# Patient Record
Sex: Male | Born: 1935 | Race: White | Hispanic: No | Marital: Married
Health system: Southern US, Community
[De-identification: ages and names within clinical notes are randomized; demographics above are authoritative.]

---

## 2004-08-27 ENCOUNTER — Ambulatory Visit: Payer: Self-pay

## 2005-06-07 ENCOUNTER — Other Ambulatory Visit: Payer: Self-pay

## 2005-06-07 ENCOUNTER — Emergency Department: Payer: Self-pay | Admitting: Unknown Physician Specialty

## 2005-06-08 ENCOUNTER — Ambulatory Visit: Payer: Self-pay | Admitting: Urology

## 2005-07-03 ENCOUNTER — Other Ambulatory Visit: Payer: Self-pay

## 2005-07-03 ENCOUNTER — Ambulatory Visit: Payer: Self-pay | Admitting: Urology

## 2005-07-30 ENCOUNTER — Ambulatory Visit: Payer: Self-pay | Admitting: Urology

## 2005-08-06 ENCOUNTER — Ambulatory Visit: Payer: Self-pay | Admitting: Urology

## 2005-08-21 ENCOUNTER — Ambulatory Visit: Payer: Self-pay | Admitting: Urology

## 2005-12-15 ENCOUNTER — Inpatient Hospital Stay: Payer: Self-pay | Admitting: General Practice

## 2005-12-18 ENCOUNTER — Encounter: Payer: Self-pay | Admitting: Internal Medicine

## 2005-12-22 ENCOUNTER — Encounter: Payer: Self-pay | Admitting: Internal Medicine

## 2005-12-24 ENCOUNTER — Ambulatory Visit: Payer: Self-pay | Admitting: Internal Medicine

## 2006-01-12 ENCOUNTER — Encounter: Payer: Self-pay | Admitting: General Practice

## 2006-01-18 ENCOUNTER — Encounter: Payer: Self-pay | Admitting: Internal Medicine

## 2006-01-18 ENCOUNTER — Encounter: Payer: Self-pay | Admitting: General Practice

## 2006-01-18 ENCOUNTER — Ambulatory Visit: Payer: Self-pay | Admitting: Urology

## 2006-02-18 ENCOUNTER — Encounter: Payer: Self-pay | Admitting: General Practice

## 2006-07-28 ENCOUNTER — Ambulatory Visit: Payer: Self-pay | Admitting: Urology

## 2006-08-18 ENCOUNTER — Ambulatory Visit: Payer: Self-pay | Admitting: General Practice

## 2006-08-18 ENCOUNTER — Other Ambulatory Visit: Payer: Self-pay

## 2006-09-01 ENCOUNTER — Inpatient Hospital Stay: Payer: Self-pay | Admitting: General Practice

## 2007-08-04 ENCOUNTER — Ambulatory Visit: Payer: Self-pay | Admitting: Urology

## 2007-09-02 IMAGING — CR DG CHEST 2V
1 series · 3 of 3 positions shown · non-contrast
Comparison: none

REASON FOR EXAM: Arm pain/dizziness
COMMENTS:

[Series 1: view not recorded · 0.17mm/px · 3 of 3 slices shown]
[im 1/3]
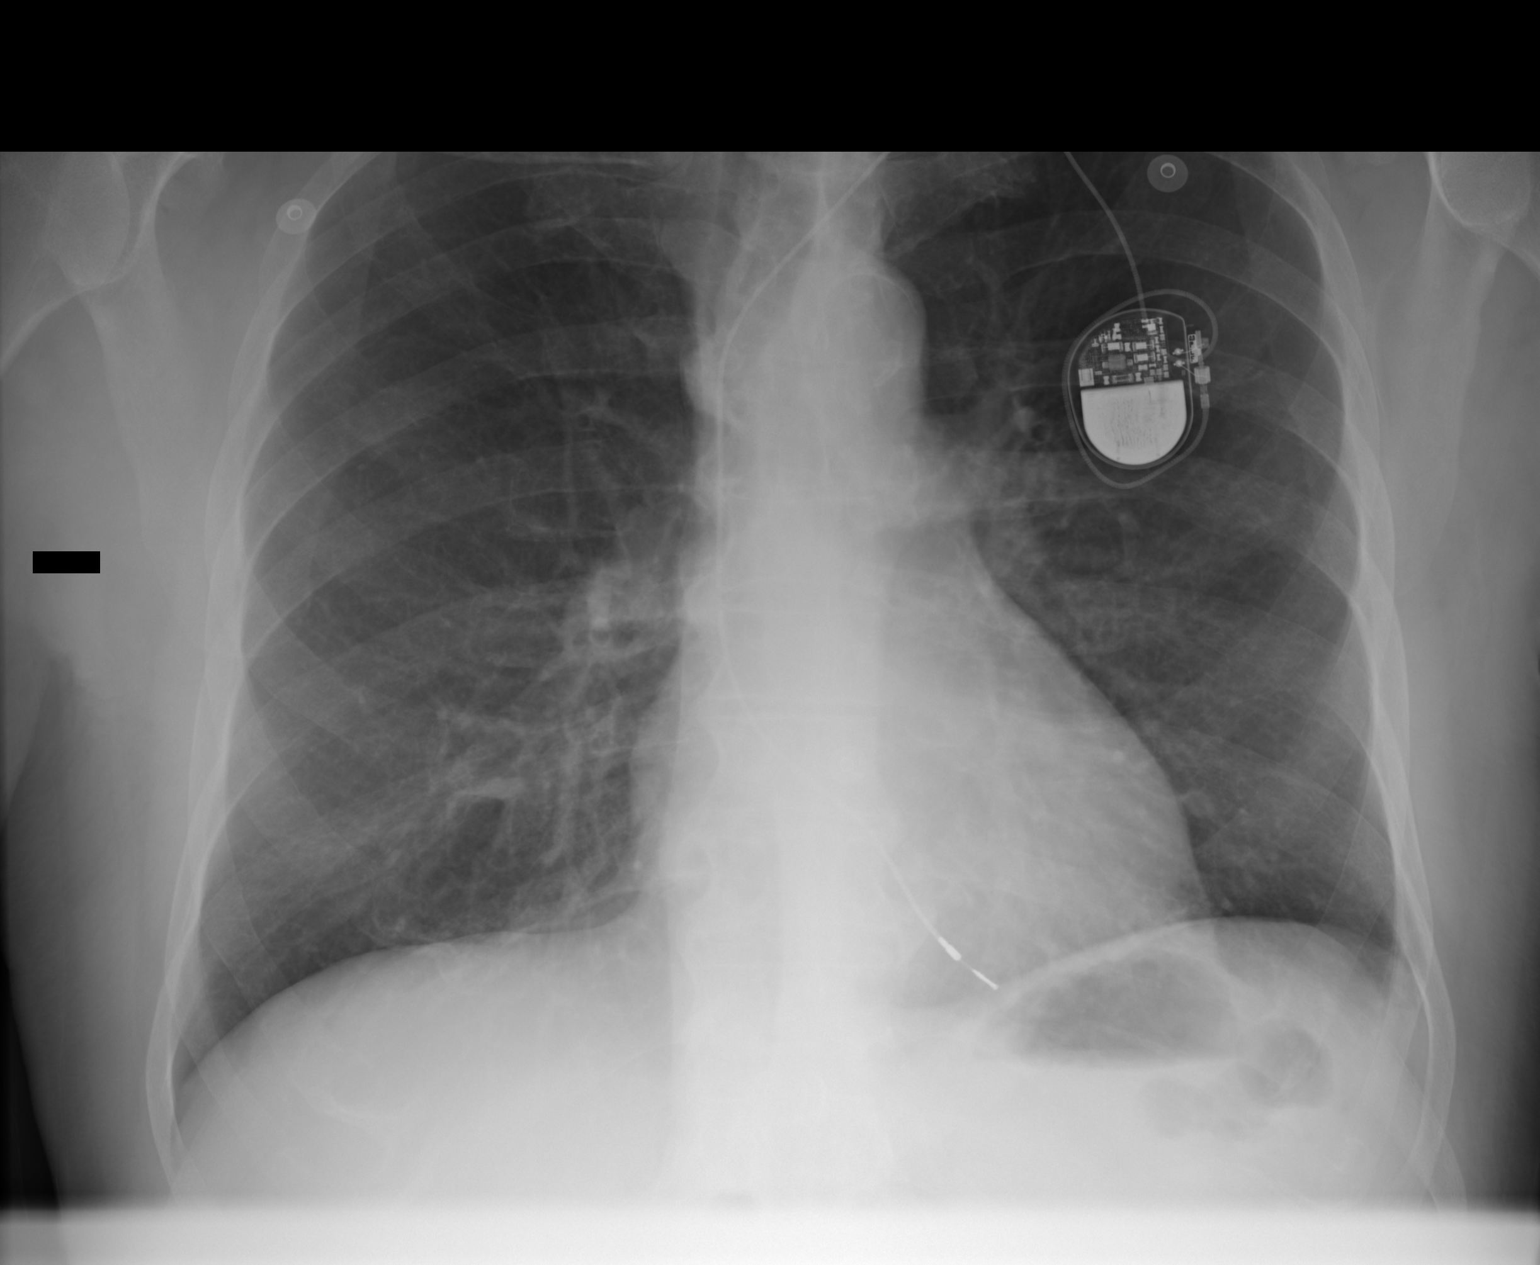
[im 2/3]
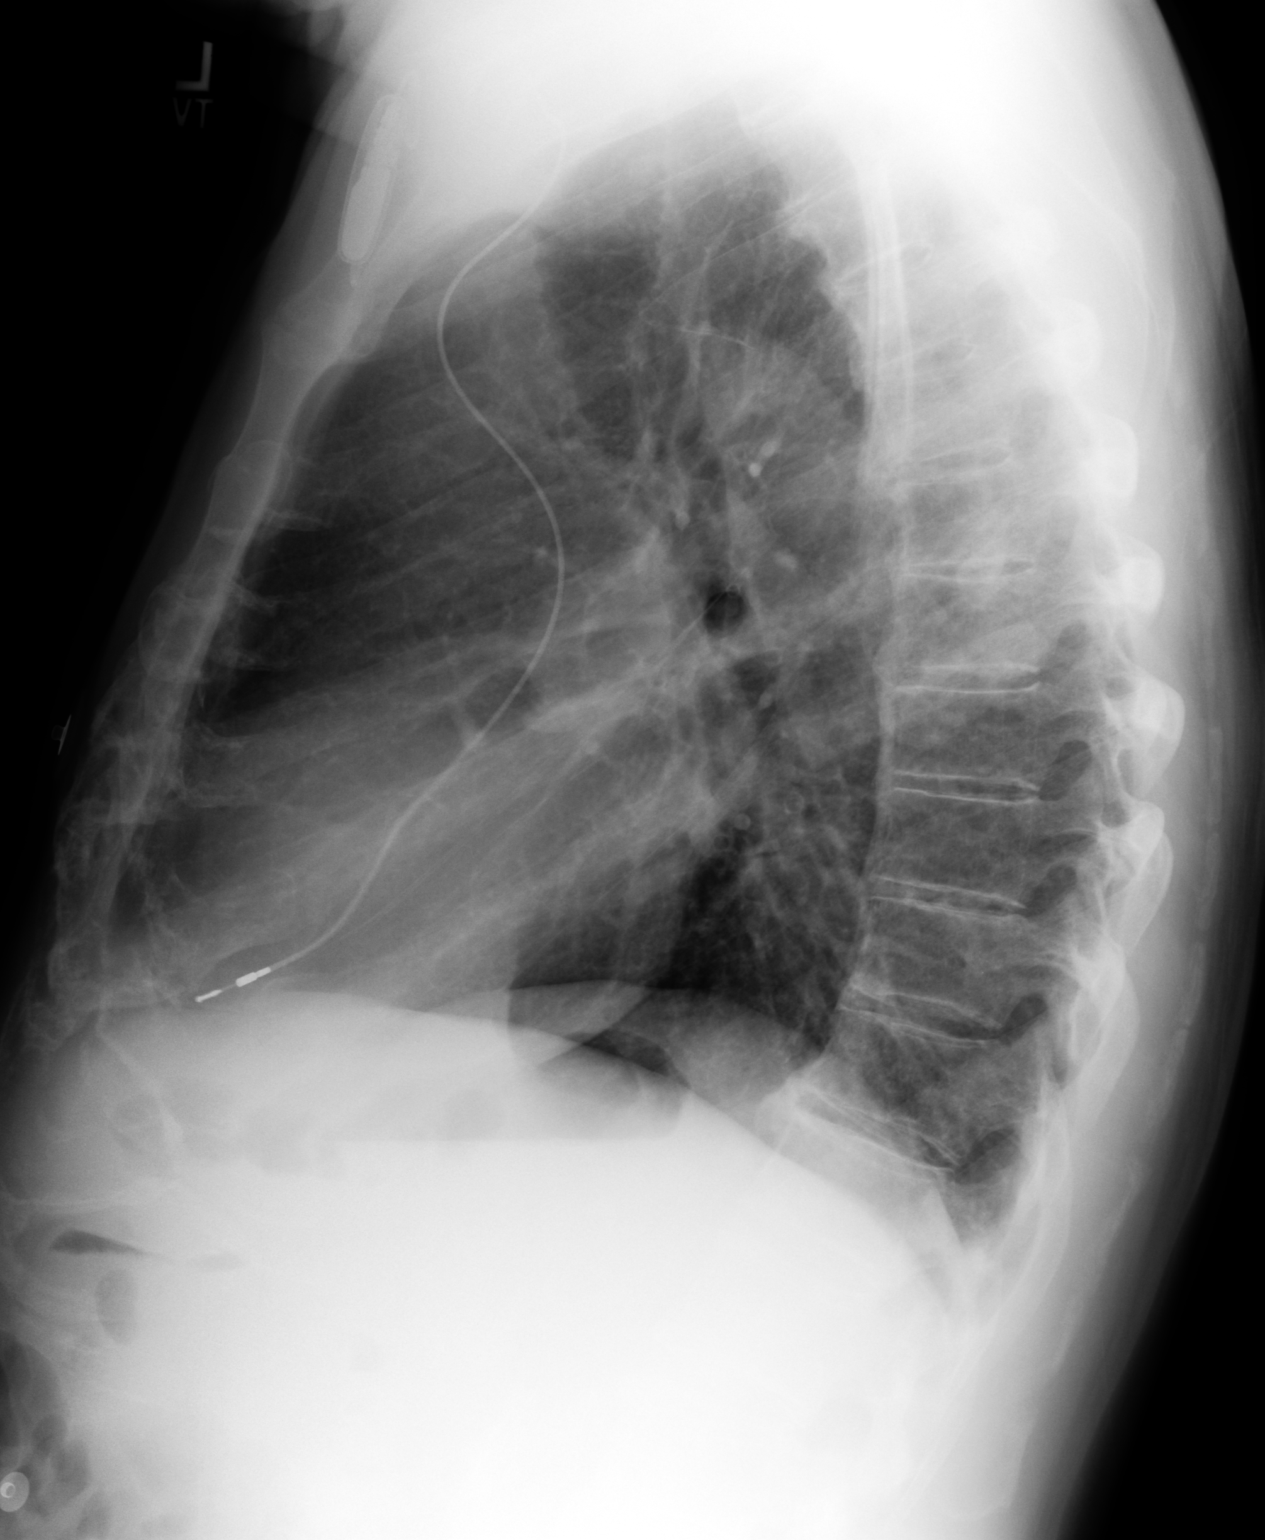
[im 3/3]
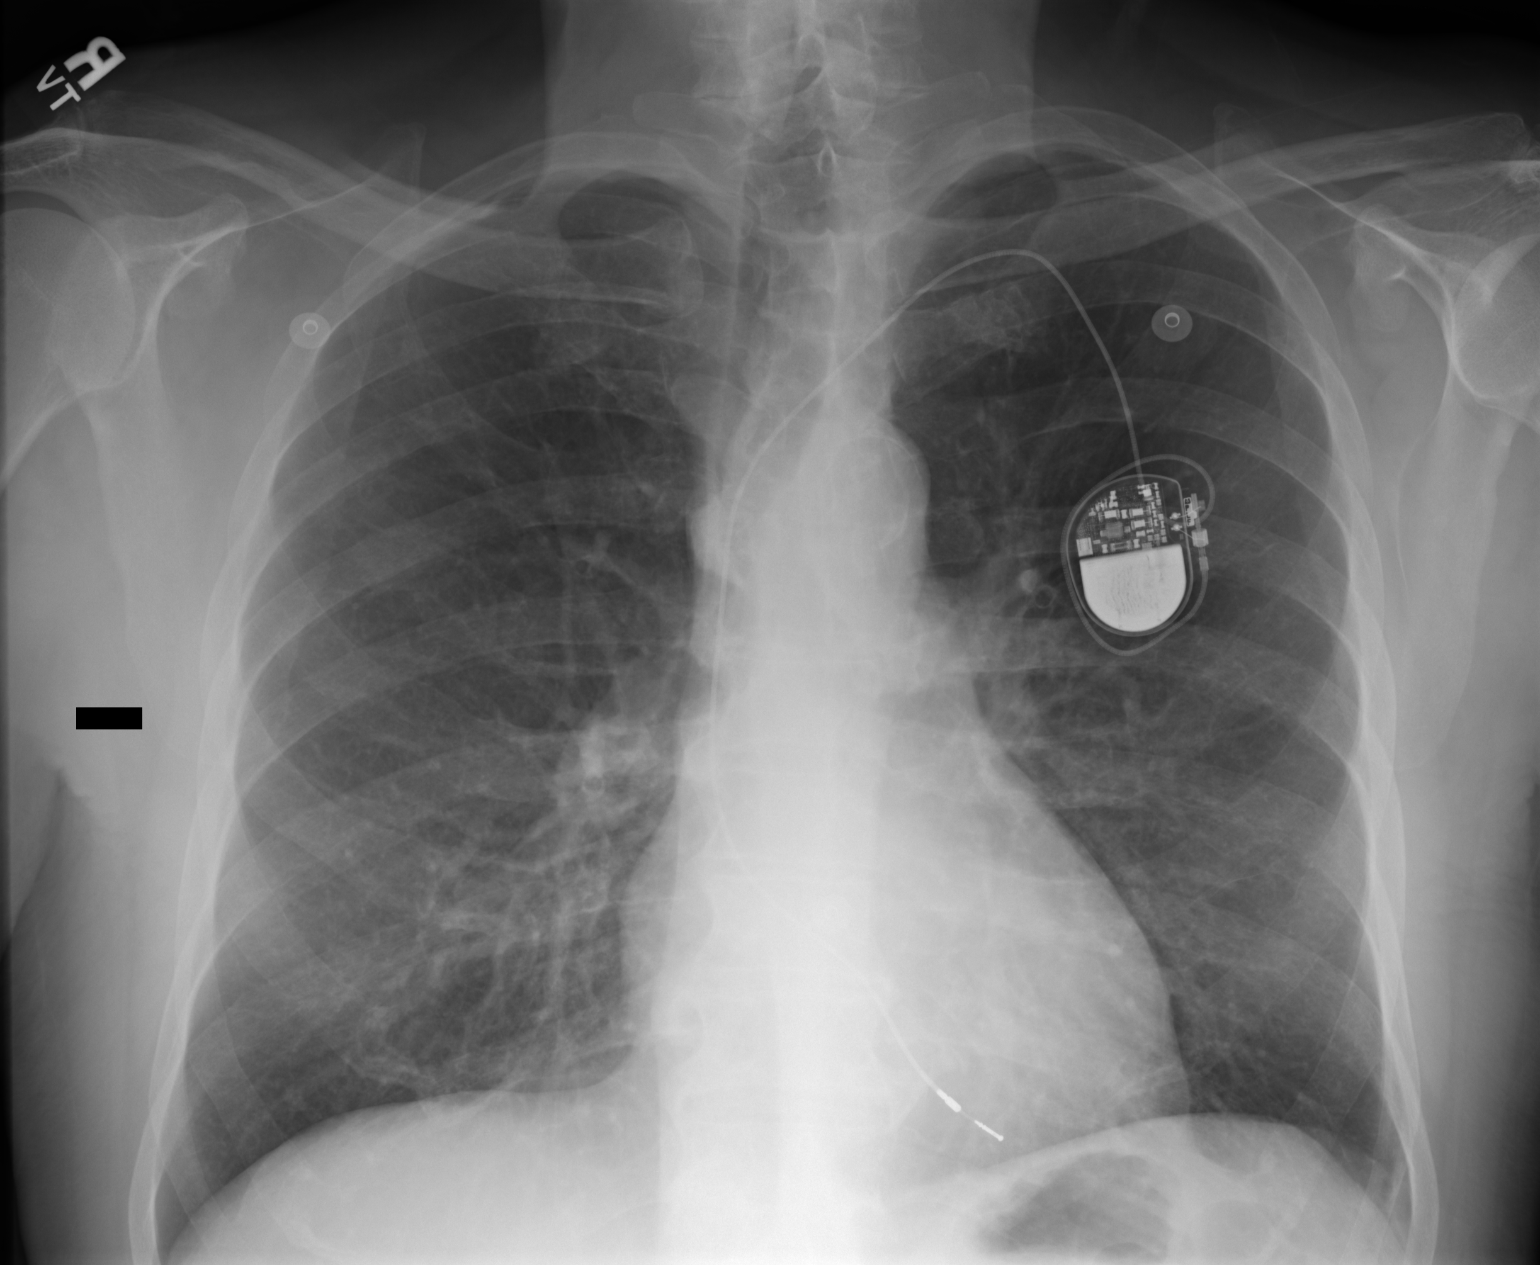

[3 of 3 positions shown; findings below may reference images not displayed]

PROCEDURE:     DXR - DXR CHEST PA (OR AP) AND LATERAL  - June 07, 2005 [DATE]

RESULT:          A LEFT-sided pacemaker device is present.  The heart is not
enlarged.  Some degenerative spurring is seen in the thoracic spine.  There
is no effusion.  There is no pneumothorax.  There is no evidence of failure.
IMPRESSION: 1.     No acute cardiopulmonary disease.  COPD change is present.
2.     Pacemaker device present.
3.     The prominence questioned in the RIGHT hilum appears to be secondary
to pulmonary vasculature.

## 2007-11-25 ENCOUNTER — Ambulatory Visit: Payer: Self-pay | Admitting: Urology

## 2008-06-08 ENCOUNTER — Emergency Department: Payer: Self-pay | Admitting: Emergency Medicine

## 2008-07-04 ENCOUNTER — Ambulatory Visit: Payer: Self-pay | Admitting: Urology

## 2008-10-11 ENCOUNTER — Ambulatory Visit: Payer: Self-pay | Admitting: Urology

## 2008-10-22 IMAGING — CR DG ABDOMEN 1V
1 series · 2 of 2 positions shown · non-contrast
Comparison: none

REASON FOR EXAM: xray kub nephrolithiasis need films
COMMENTS:

[Series 1: view not recorded · 0.17mm/px · 2 of 2 slices shown]
[im 1/2]
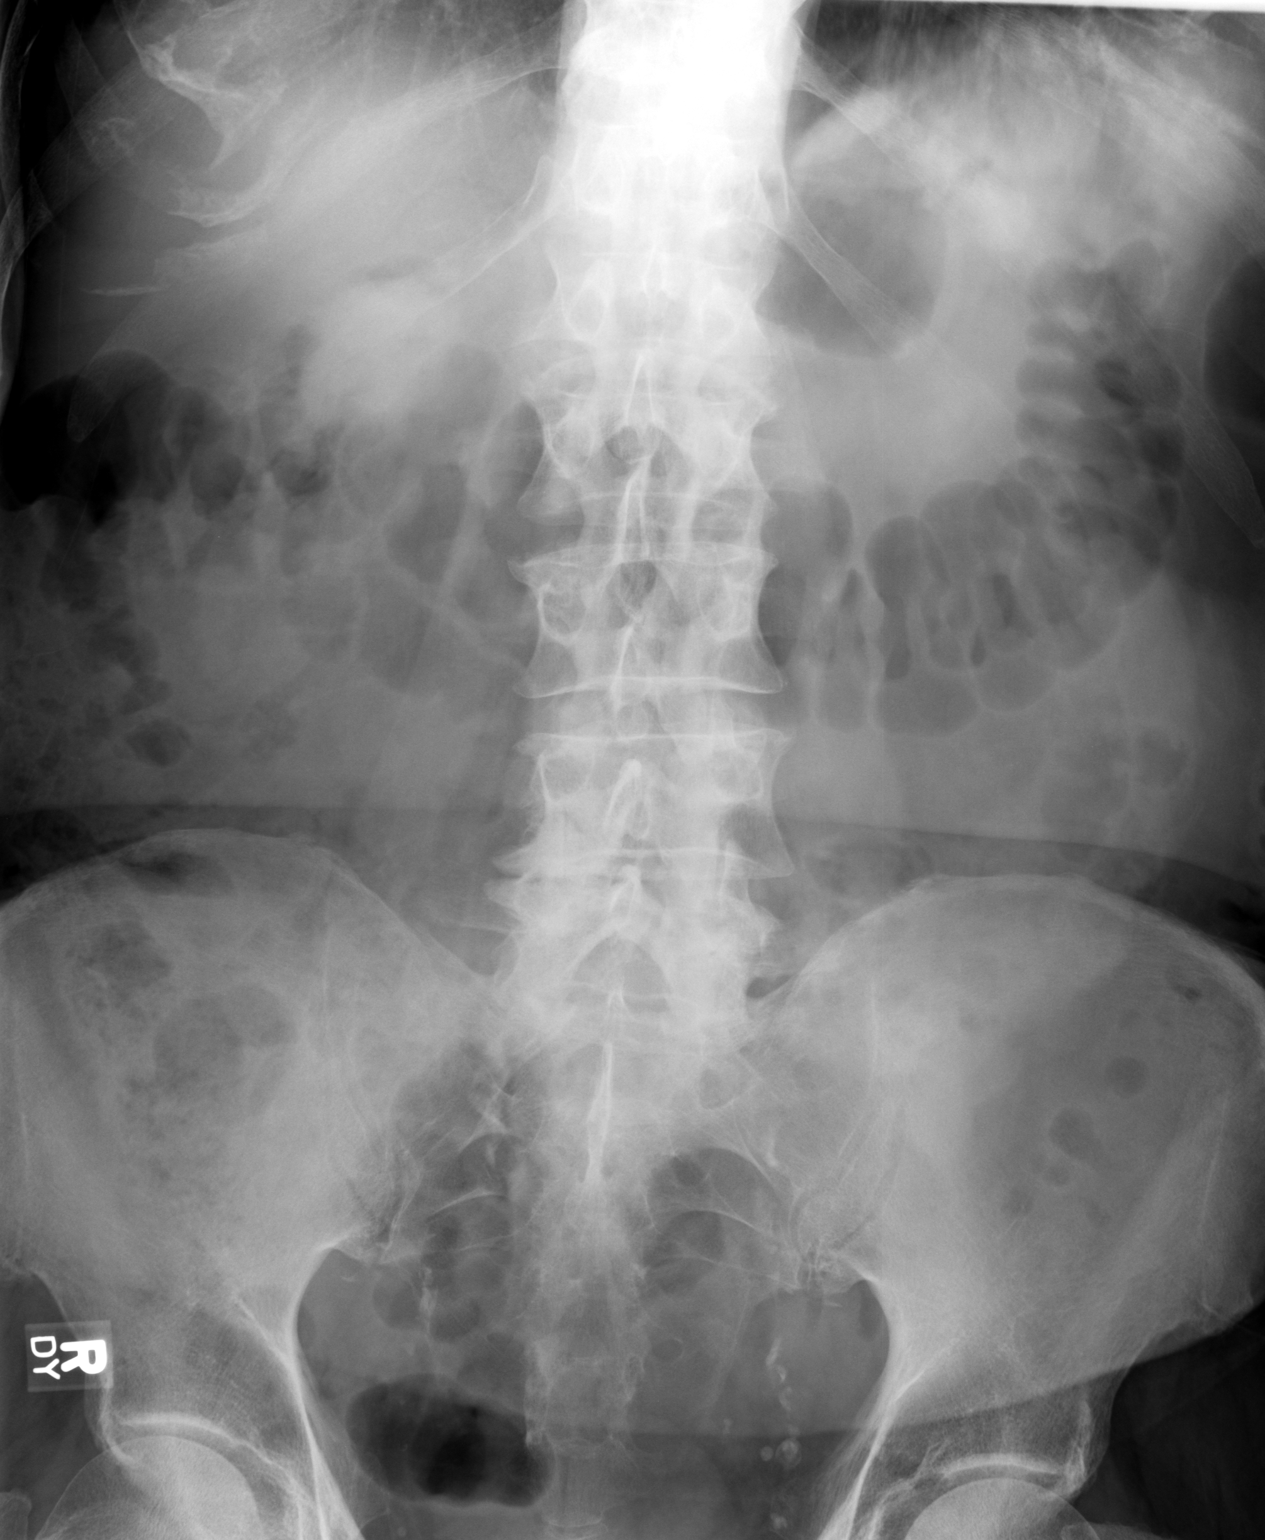
[im 2/2]
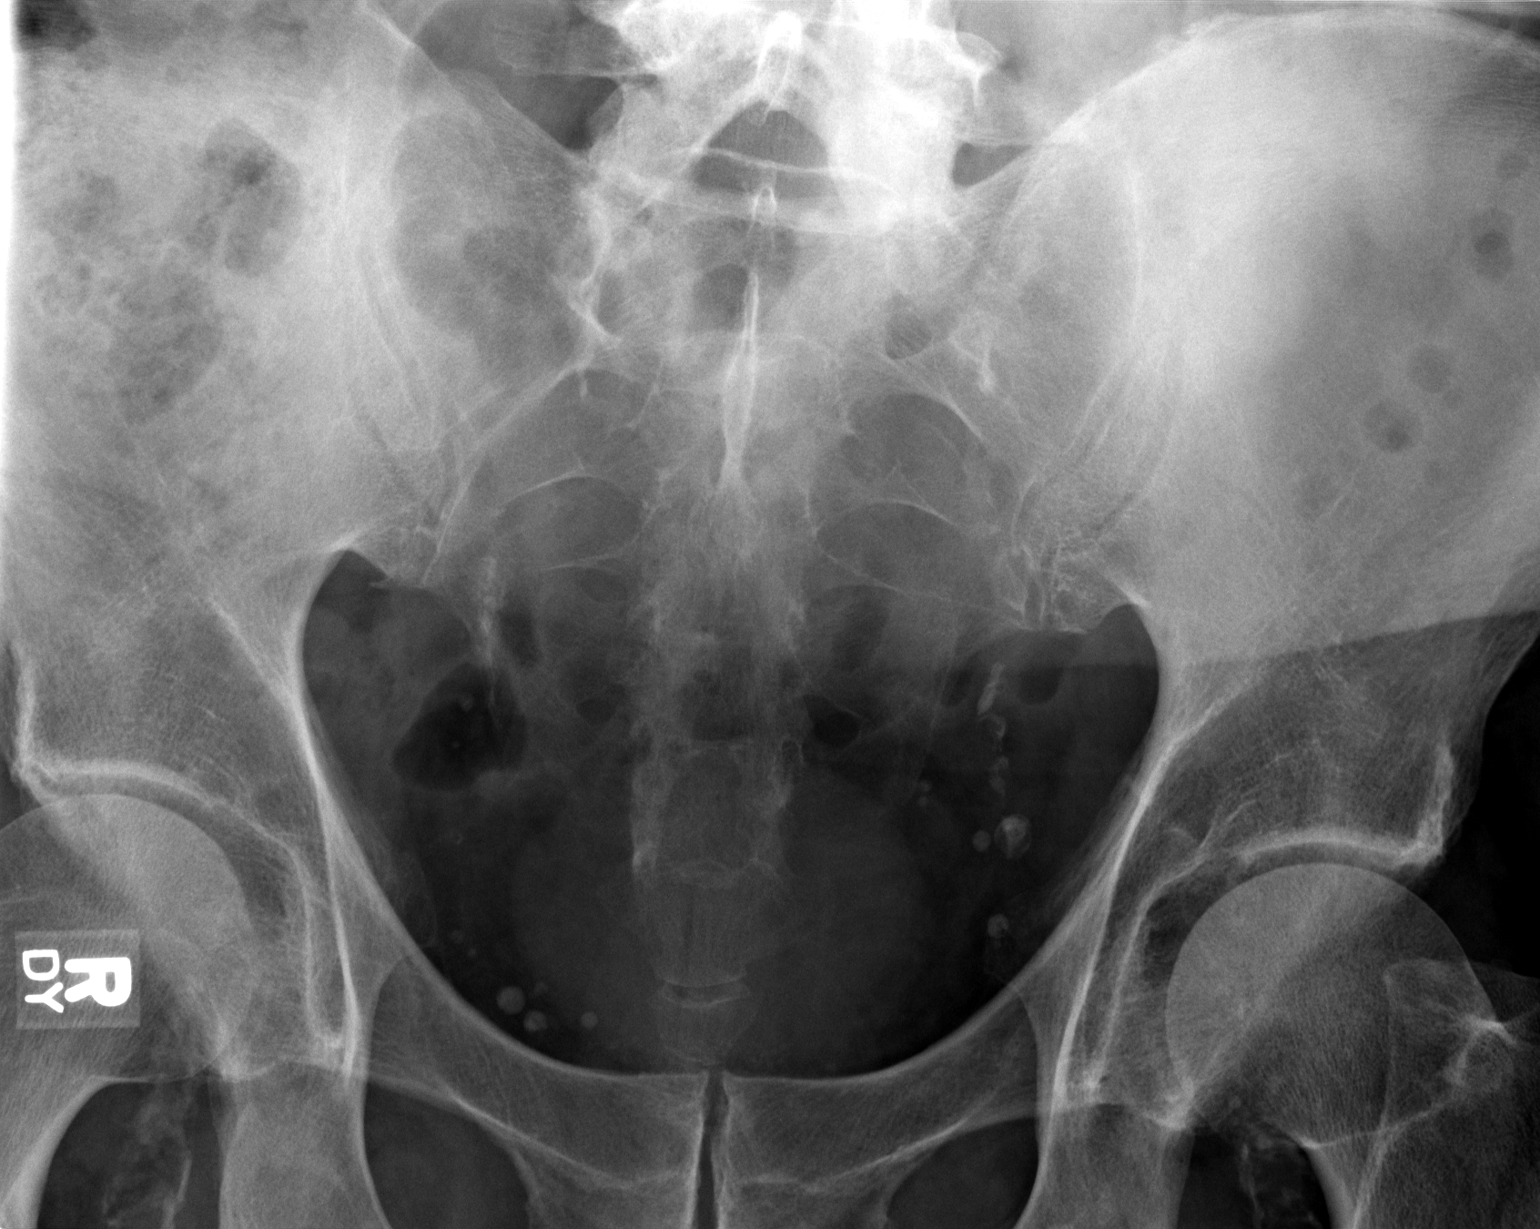

[2 of 2 positions shown; findings below may reference images not displayed]

PROCEDURE:     DXR - DXR KIDNEY URETER BLADDER  - July 28, 2006  [DATE]

RESULT:     Comparison is made to the prior exam of 01/18/2006. The patient
had a 5 mm RIGHT renal stone observed on prior CT of 06/07/2005. On the
current plain film, no definite renal or ureteral calcifications are
identified. Poorly calcified stones could still be present and not
identified.
IMPRESSION: 1.     No definite renal or ureteral stones are identified on plain film
examination.

## 2008-12-03 ENCOUNTER — Ambulatory Visit: Payer: Self-pay | Admitting: Family Medicine

## 2009-04-26 ENCOUNTER — Ambulatory Visit: Payer: Self-pay | Admitting: Cardiology

## 2009-04-30 ENCOUNTER — Ambulatory Visit: Payer: Self-pay | Admitting: Cardiology

## 2009-10-29 ENCOUNTER — Ambulatory Visit: Payer: Self-pay | Admitting: Urology

## 2010-02-19 IMAGING — CR DG ABDOMEN 1V
1 series · 2 of 2 positions shown · non-contrast
Comparison: none

REASON FOR EXAM: Nephrolithiasis
COMMENTS:

[Series 1: view not recorded · 0.17mm/px · 2 of 2 slices shown]
[im 1/2]
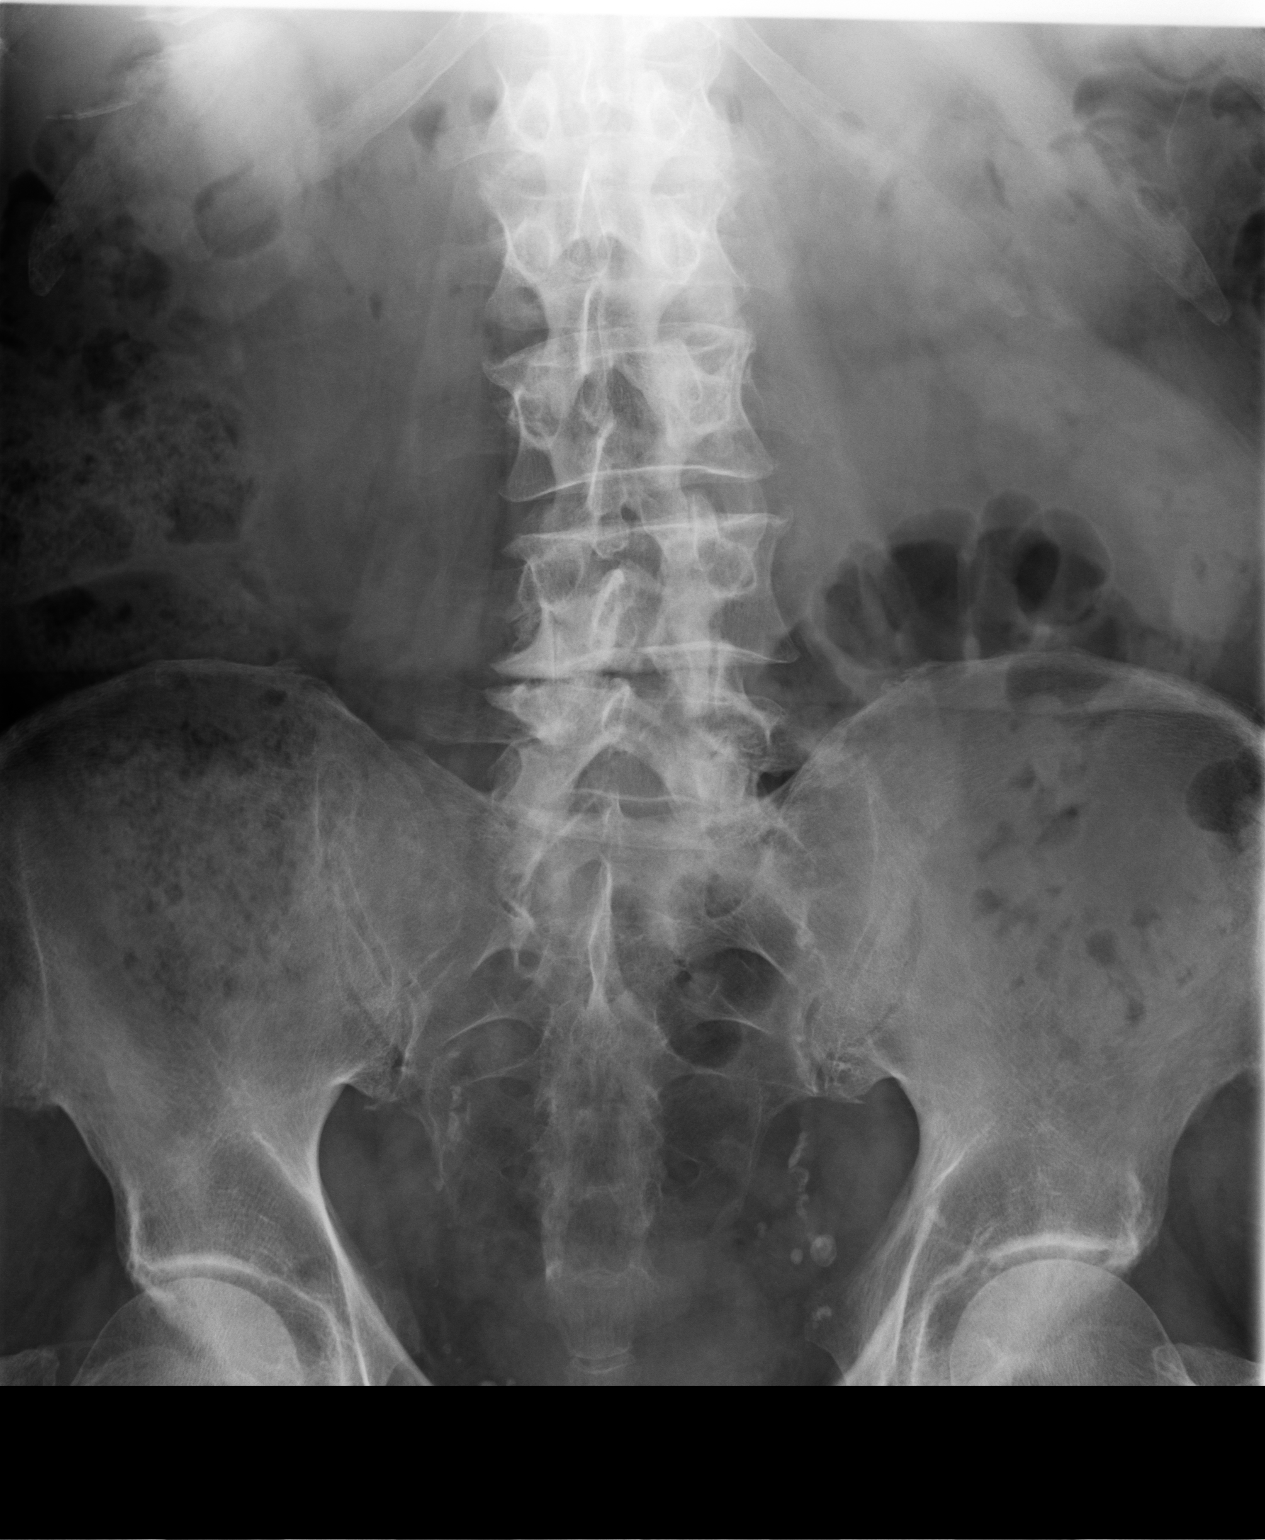
[im 2/2]
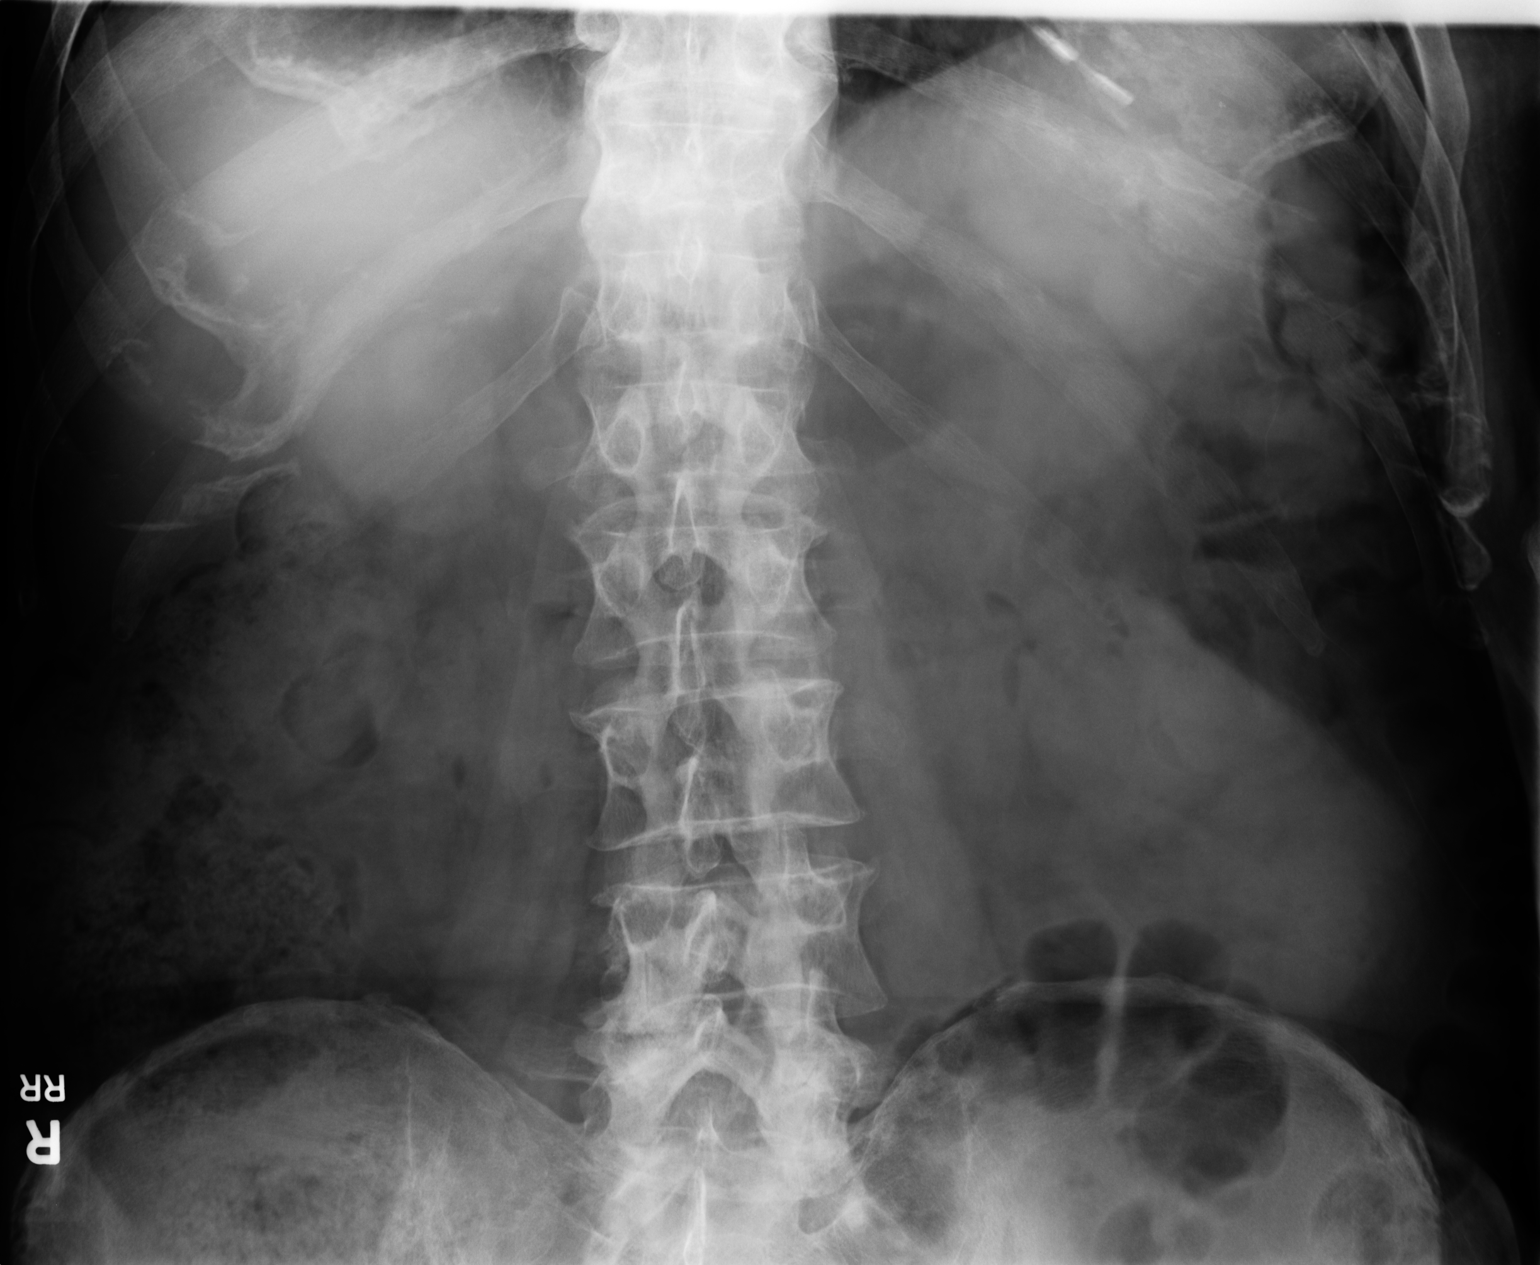

[2 of 2 positions shown; findings below may reference images not displayed]

PROCEDURE:     DXR - DXR KIDNEY URETER BLADDER  - November 25, 2007  [DATE]

RESULT:     Comparison is made to a prior exam of 08/04/2007.

There is a faint, 1.7 cm density projected over the lower pole of the LEFT
kidney. This also overlies the twelfth, LEFT rib. This could possibly
represent a LEFT renal stone but may be artifactual. The finding is not
definitely seen on the second view of the abdomen also obtained on this
date. No RIGHT renal calcifications are seen. No ureteral calcifications are
identified.
IMPRESSION: Please see above.

## 2010-03-20 ENCOUNTER — Ambulatory Visit: Payer: Self-pay | Admitting: Family Medicine

## 2010-04-29 ENCOUNTER — Encounter: Payer: Self-pay | Admitting: Family Medicine

## 2010-05-21 ENCOUNTER — Encounter: Payer: Self-pay | Admitting: Family Medicine

## 2010-06-19 ENCOUNTER — Encounter: Payer: Self-pay | Admitting: Family Medicine

## 2010-07-20 ENCOUNTER — Encounter: Payer: Self-pay | Admitting: Family Medicine

## 2010-09-03 IMAGING — CR DG ABDOMEN 1V
1 series · 2 of 2 positions shown · non-contrast
Comparison: none

REASON FOR EXAM: hematuria
COMMENTS:

PROCEDURE:     DXR - DXR KIDNEY URETER BLADDER  - June 08, 2008 [DATE]
RESULT:     The soft tissue structures are unremarkable. The gas pattern is
nonspecific. Aortoiliac atherosclerotic vascular disease is noted. Calcified
pelvic densities are noted consistent with phleboliths.

[Series 1: view not recorded · 0.17mm/px · 2 of 2 slices shown]
[im 1/2]
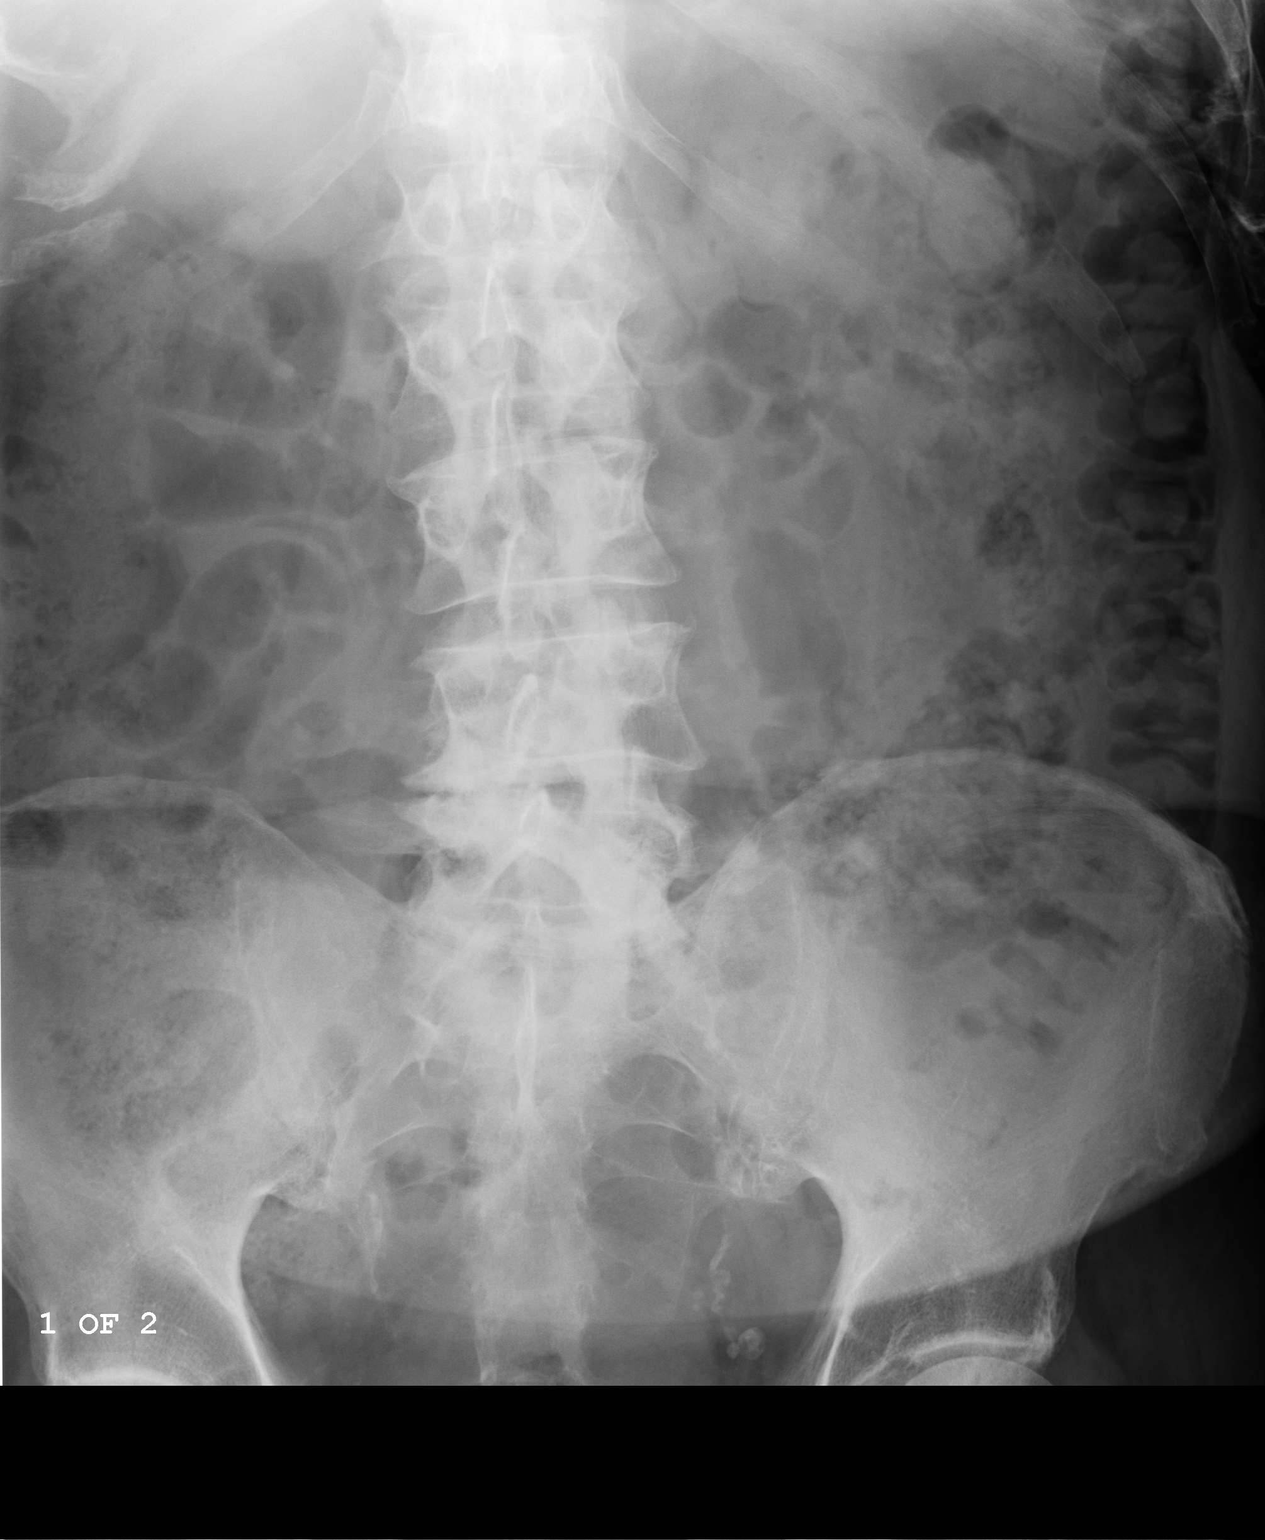
[im 2/2]
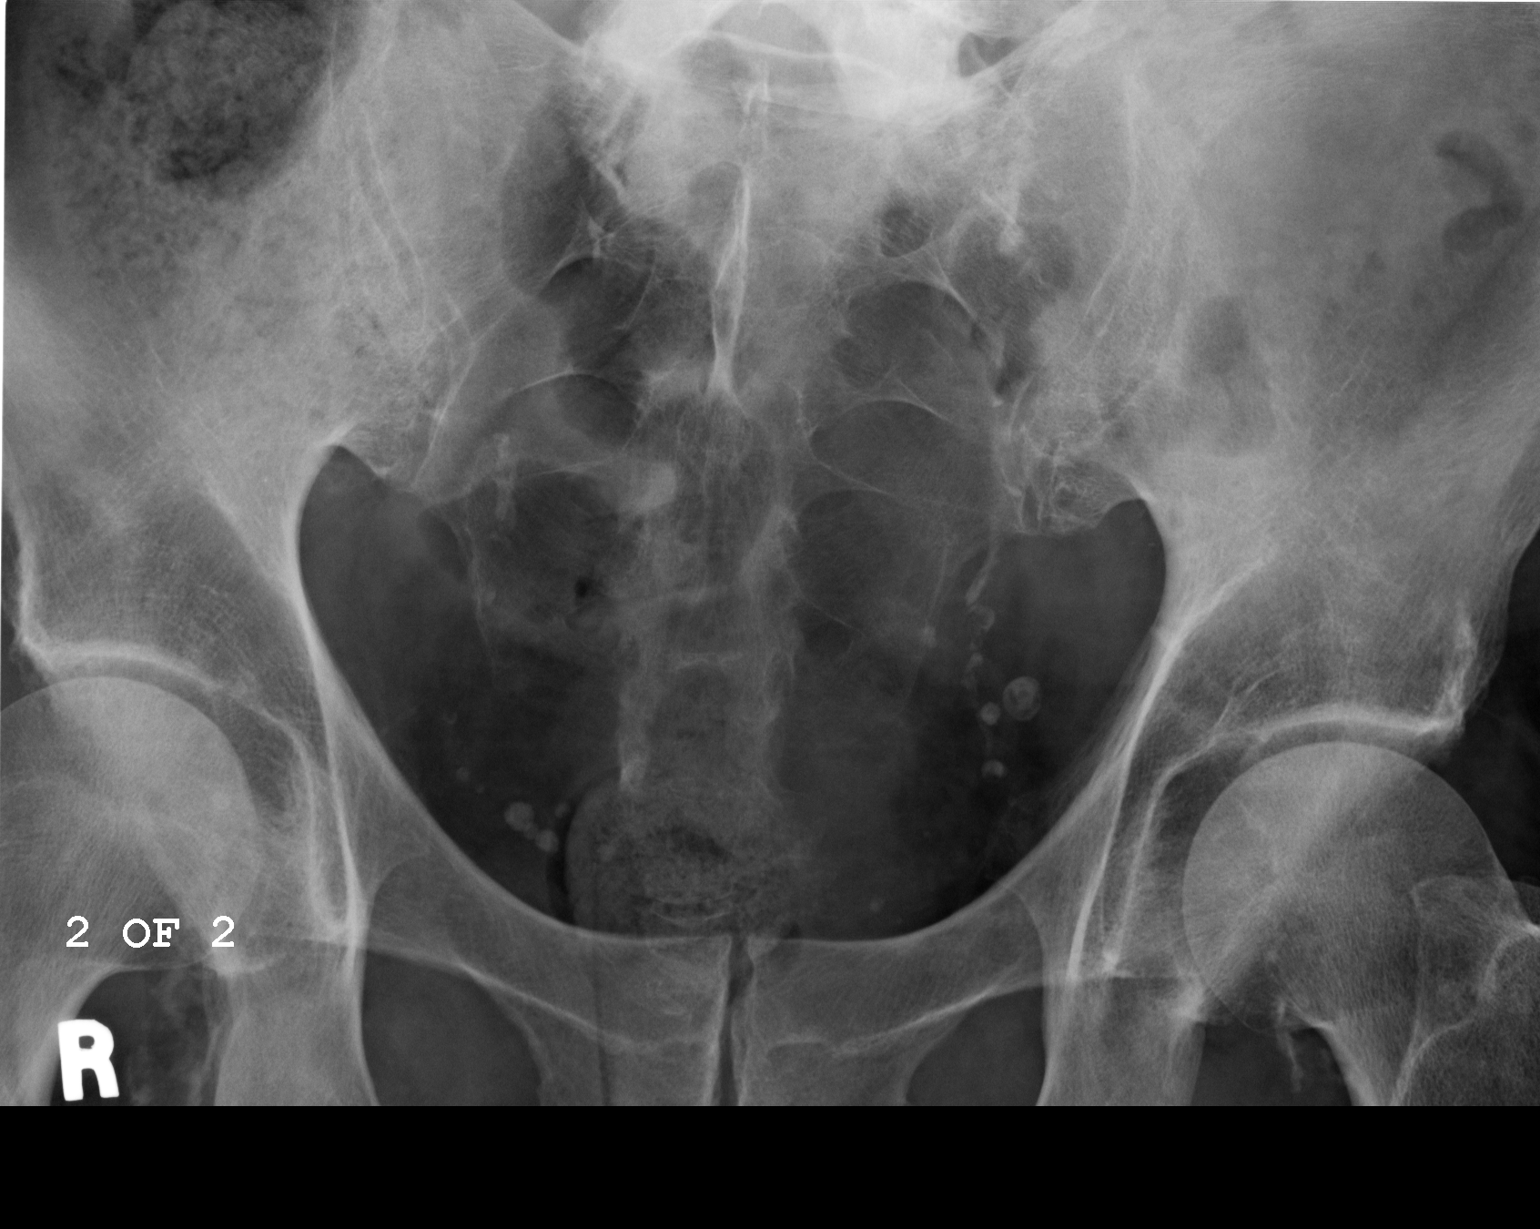

[2 of 2 positions shown; findings below may reference images not displayed]

IMPRESSION: Nonspecific exam. Calcified pelvic phleboliths are noted. If
ureteral stone is suspected, we can perform IVP or CT.

## 2010-11-05 ENCOUNTER — Ambulatory Visit: Payer: Self-pay | Admitting: Urology

## 2011-07-12 ENCOUNTER — Emergency Department: Payer: Self-pay | Admitting: *Deleted

## 2011-07-12 LAB — COMPREHENSIVE METABOLIC PANEL
Alkaline Phosphatase: 56 U/L (ref 50–136)
Anion Gap: 13 (ref 7–16)
BUN: 35 mg/dL — ABNORMAL HIGH (ref 7–18)
Bilirubin,Total: 0.8 mg/dL (ref 0.2–1.0)
Chloride: 106 mmol/L (ref 98–107)
Co2: 26 mmol/L (ref 21–32)
Creatinine: 1.34 mg/dL — ABNORMAL HIGH (ref 0.60–1.30)
EGFR (African American): >60
Osmolality: 302 (ref 275–301)
Potassium: 4.1 mmol/L (ref 3.5–5.1)
SGOT(AST): 25 U/L (ref 15–37)
Sodium: 145 mmol/L (ref 136–145)
Total Protein: 8.4 g/dL — ABNORMAL HIGH (ref 6.4–8.2)

## 2011-07-12 LAB — CBC
HCT: 41.5 % (ref 40.0–52.0)
MCH: 29.9 pg (ref 26.0–34.0)
MCHC: 33.1 g/dL (ref 32.0–36.0)
MCV: 90 fL (ref 80–100)
Platelet: 228 10*3/uL (ref 150–440)
WBC: 8.4 10*3/uL (ref 3.8–10.6)

## 2011-07-13 LAB — URINALYSIS, COMPLETE
Bacteria: NONE SEEN
Bilirubin,UR: NEGATIVE
Blood: NEGATIVE
Glucose,UR: NEGATIVE mg/dL (ref 0–75)
Leukocyte Esterase: NEGATIVE
Nitrite: NEGATIVE
Ph: 5 (ref 4.5–8.0)

## 2011-12-02 ENCOUNTER — Inpatient Hospital Stay: Payer: Self-pay | Admitting: Specialist

## 2011-12-02 ENCOUNTER — Ambulatory Visit: Payer: Self-pay | Admitting: Family Medicine

## 2011-12-02 LAB — URINALYSIS, COMPLETE
Blood: NEGATIVE
Nitrite: NEGATIVE
Protein: NEGATIVE
Specific Gravity: 1.023 (ref 1.003–1.030)

## 2011-12-02 LAB — COMPREHENSIVE METABOLIC PANEL
Alkaline Phosphatase: 87 U/L (ref 50–136)
Anion Gap: 6 — ABNORMAL LOW (ref 7–16)
BUN: 25 mg/dL — ABNORMAL HIGH (ref 7–18)
Bilirubin,Total: 0.7 mg/dL (ref 0.2–1.0)
Calcium, Total: 9 mg/dL (ref 8.5–10.1)
Chloride: 108 mmol/L — ABNORMAL HIGH (ref 98–107)
Co2: 28 mmol/L (ref 21–32)
EGFR (African American): >60
EGFR (Non-African Amer.): 59 — ABNORMAL LOW
Glucose: 91 mg/dL (ref 65–99)
Osmolality: 287 (ref 275–301)
Potassium: 4.5 mmol/L (ref 3.5–5.1)
Sodium: 142 mmol/L (ref 136–145)
Total Protein: 7.1 g/dL (ref 6.4–8.2)

## 2011-12-02 LAB — CBC
MCH: 30.6 pg (ref 26.0–34.0)
Platelet: 249 10*3/uL (ref 150–440)
RBC: 3.87 10*6/uL — ABNORMAL LOW (ref 4.40–5.90)
WBC: 7.5 10*3/uL (ref 3.8–10.6)

## 2011-12-02 LAB — PROTIME-INR
INR: 1
Prothrombin Time: 13.9 secs (ref 11.5–14.7)

## 2011-12-03 LAB — CBC WITH DIFFERENTIAL/PLATELET
Basophil %: 0.4 %
Eosinophil %: 4 %
HGB: 11.3 g/dL — ABNORMAL LOW (ref 13.0–18.0)
Lymphocyte #: 0.5 10*3/uL — ABNORMAL LOW (ref 1.0–3.6)
MCH: 30.3 pg (ref 26.0–34.0)
MCV: 90 fL (ref 80–100)
Monocyte #: 0.7 x10 3/mm (ref 0.2–1.0)
Monocyte %: 8.6 %
Neutrophil %: 81.4 %
RBC: 3.72 10*6/uL — ABNORMAL LOW (ref 4.40–5.90)

## 2011-12-03 LAB — BASIC METABOLIC PANEL
Anion Gap: 9 (ref 7–16)
BUN: 24 mg/dL — ABNORMAL HIGH (ref 7–18)
Chloride: 109 mmol/L — ABNORMAL HIGH (ref 98–107)
Co2: 26 mmol/L (ref 21–32)
Creatinine: 0.99 mg/dL (ref 0.60–1.30)
Glucose: 96 mg/dL (ref 65–99)
Potassium: 3.8 mmol/L (ref 3.5–5.1)
Sodium: 144 mmol/L (ref 136–145)

## 2011-12-04 DIAGNOSIS — I369 Nonrheumatic tricuspid valve disorder, unspecified: Secondary | ICD-10-CM

## 2011-12-04 LAB — CBC WITH DIFFERENTIAL/PLATELET
Basophil #: 0 10*3/uL (ref 0.0–0.1)
Basophil %: 0.3 %
Eosinophil #: 0.1 10*3/uL (ref 0.0–0.7)
Eosinophil %: 1.3 %
HCT: 27.3 % — ABNORMAL LOW (ref 40.0–52.0)
HGB: 9.3 g/dL — ABNORMAL LOW (ref 13.0–18.0)
Lymphocyte %: 6.1 %
Monocyte %: 11.8 %
Neutrophil #: 5.9 10*3/uL (ref 1.4–6.5)
Neutrophil %: 80.5 %
RBC: 3.12 10*6/uL — ABNORMAL LOW (ref 4.40–5.90)
WBC: 7.4 10*3/uL (ref 3.8–10.6)

## 2011-12-04 LAB — BASIC METABOLIC PANEL
Anion Gap: 8 (ref 7–16)
BUN: 22 mg/dL — ABNORMAL HIGH (ref 7–18)
Calcium, Total: 8.2 mg/dL — ABNORMAL LOW (ref 8.5–10.1)
Co2: 26 mmol/L (ref 21–32)
Creatinine: 1.15 mg/dL (ref 0.60–1.30)
EGFR (African American): >60
EGFR (Non-African Amer.): 56 — ABNORMAL LOW
Glucose: 105 mg/dL — ABNORMAL HIGH (ref 65–99)
Potassium: 3.7 mmol/L (ref 3.5–5.1)
Sodium: 142 mmol/L (ref 136–145)

## 2011-12-05 LAB — BASIC METABOLIC PANEL
Anion Gap: 14 (ref 7–16)
BUN: 27 mg/dL — ABNORMAL HIGH (ref 7–18)
Calcium, Total: 8.8 mg/dL (ref 8.5–10.1)
Chloride: 104 mmol/L (ref 98–107)
EGFR (Non-African Amer.): 50 — ABNORMAL LOW
Glucose: 113 mg/dL — ABNORMAL HIGH (ref 65–99)
Osmolality: 289 (ref 275–301)

## 2011-12-05 LAB — HEMOGLOBIN: HGB: 9.1 g/dL — ABNORMAL LOW (ref 13.0–18.0)

## 2011-12-07 LAB — POTASSIUM: Potassium: 3.4 mmol/L — ABNORMAL LOW (ref 3.5–5.1)

## 2011-12-07 LAB — MAGNESIUM: Magnesium: 1.9 mg/dL

## 2011-12-08 ENCOUNTER — Encounter: Payer: Self-pay | Admitting: Internal Medicine

## 2011-12-08 LAB — POTASSIUM: Potassium: 3.9 mmol/L (ref 3.5–5.1)

## 2011-12-09 LAB — CULTURE, BLOOD (SINGLE)

## 2011-12-10 ENCOUNTER — Inpatient Hospital Stay: Payer: Self-pay | Admitting: Internal Medicine

## 2011-12-10 LAB — COMPREHENSIVE METABOLIC PANEL
Albumin: 2.2 g/dL — ABNORMAL LOW (ref 3.4–5.0)
Alkaline Phosphatase: 90 U/L (ref 50–136)
Anion Gap: 7 (ref 7–16)
BUN: 26 mg/dL — ABNORMAL HIGH (ref 7–18)
Bilirubin,Total: 0.5 mg/dL (ref 0.2–1.0)
Calcium, Total: 8.4 mg/dL — ABNORMAL LOW (ref 8.5–10.1)
Glucose: 96 mg/dL (ref 65–99)
Osmolality: 291 (ref 275–301)
Potassium: 3.9 mmol/L (ref 3.5–5.1)
SGOT(AST): 54 U/L — ABNORMAL HIGH (ref 15–37)
SGPT (ALT): 58 U/L (ref 12–78)
Sodium: 144 mmol/L (ref 136–145)
Total Protein: 5.7 g/dL — ABNORMAL LOW (ref 6.4–8.2)

## 2011-12-10 LAB — URINALYSIS, COMPLETE
Bacteria: NONE SEEN
Glucose,UR: NEGATIVE mg/dL (ref 0–75)
Leukocyte Esterase: NEGATIVE
Nitrite: NEGATIVE
Ph: 6 (ref 4.5–8.0)
Specific Gravity: 1.019 (ref 1.003–1.030)
Squamous Epithelial: <1
WBC UR: 2 /HPF (ref 0–5)

## 2011-12-10 LAB — CK TOTAL AND CKMB (NOT AT ARMC)
CK, Total: 47 U/L (ref 35–232)
CK, Total: 45 U/L (ref 35–232)
CK-MB: 2 ng/mL (ref 0.5–3.6)
CK-MB: 3.1 ng/mL (ref 0.5–3.6)

## 2011-12-10 LAB — CBC
HCT: 28.8 % — ABNORMAL LOW (ref 40.0–52.0)
MCH: 29.6 pg (ref 26.0–34.0)
MCHC: 33.4 g/dL (ref 32.0–36.0)
MCV: 89 fL (ref 80–100)
Platelet: 378 10*3/uL (ref 150–440)
RBC: 3.26 10*6/uL — ABNORMAL LOW (ref 4.40–5.90)
RDW: 13.6 % (ref 11.5–14.5)

## 2011-12-10 LAB — TROPONIN I: Troponin-I: <0.02 ng/mL

## 2011-12-11 LAB — CBC WITH DIFFERENTIAL/PLATELET
Basophil #: 0.1 10*3/uL (ref 0.0–0.1)
Basophil %: 0.5 %
Eosinophil #: 0.3 10*3/uL (ref 0.0–0.7)
HCT: 31.9 % — ABNORMAL LOW (ref 40.0–52.0)
Lymphocyte #: 0.6 10*3/uL — ABNORMAL LOW (ref 1.0–3.6)
Lymphocyte %: 6.5 %
MCH: 29.8 pg (ref 26.0–34.0)
MCHC: 33.9 g/dL (ref 32.0–36.0)
Monocyte #: 0.6 x10 3/mm (ref 0.2–1.0)
Monocyte %: 6.8 %
Neutrophil #: 7.8 10*3/uL — ABNORMAL HIGH (ref 1.4–6.5)
RBC: 3.62 10*6/uL — ABNORMAL LOW (ref 4.40–5.90)
RDW: 13.5 % (ref 11.5–14.5)

## 2011-12-11 LAB — BASIC METABOLIC PANEL
BUN: 22 mg/dL — ABNORMAL HIGH (ref 7–18)
Chloride: 109 mmol/L — ABNORMAL HIGH (ref 98–107)
Creatinine: 0.94 mg/dL (ref 0.60–1.30)
EGFR (African American): >60
EGFR (Non-African Amer.): >60
Glucose: 96 mg/dL (ref 65–99)
Osmolality: 290 (ref 275–301)
Potassium: 4 mmol/L (ref 3.5–5.1)
Sodium: 144 mmol/L (ref 136–145)

## 2011-12-11 LAB — TROPONIN I: Troponin-I: <0.02 ng/mL

## 2011-12-11 LAB — CK TOTAL AND CKMB (NOT AT ARMC)
CK, Total: 50 U/L (ref 35–232)
CK-MB: 3.4 ng/mL (ref 0.5–3.6)

## 2011-12-11 LAB — MAGNESIUM: Magnesium: 2.2 mg/dL

## 2011-12-12 LAB — URINE CULTURE

## 2011-12-12 LAB — CBC WITH DIFFERENTIAL/PLATELET
Basophil #: 0.1 10*3/uL (ref 0.0–0.1)
Basophil %: 0.6 %
Eosinophil #: 0.4 10*3/uL (ref 0.0–0.7)
Eosinophil %: 4.1 %
HCT: 30.6 % — ABNORMAL LOW (ref 40.0–52.0)
HGB: 10.4 g/dL — ABNORMAL LOW (ref 13.0–18.0)
Lymphocyte #: 0.7 10*3/uL — ABNORMAL LOW (ref 1.0–3.6)
Lymphocyte %: 7.3 %
MCH: 29.8 pg (ref 26.0–34.0)
MCHC: 34 g/dL (ref 32.0–36.0)
MCV: 88 fL (ref 80–100)
Monocyte #: 0.8 x10 3/mm (ref 0.2–1.0)
Monocyte %: 7.6 %
Neutrophil #: 8 10*3/uL — ABNORMAL HIGH (ref 1.4–6.5)
RBC: 3.48 10*6/uL — ABNORMAL LOW (ref 4.40–5.90)
RDW: 13.6 % (ref 11.5–14.5)

## 2011-12-12 LAB — BASIC METABOLIC PANEL
Anion Gap: 9 (ref 7–16)
Calcium, Total: 8.6 mg/dL (ref 8.5–10.1)
Creatinine: 1.04 mg/dL (ref 0.60–1.30)
EGFR (Non-African Amer.): >60
Glucose: 133 mg/dL — ABNORMAL HIGH (ref 65–99)
Osmolality: 291 (ref 275–301)
Potassium: 3.5 mmol/L (ref 3.5–5.1)

## 2011-12-12 LAB — VANCOMYCIN, TROUGH: Vancomycin, Trough: 18 ug/mL (ref 10–20)

## 2011-12-14 LAB — COMPREHENSIVE METABOLIC PANEL
Albumin: 2.2 g/dL — ABNORMAL LOW (ref 3.4–5.0)
Alkaline Phosphatase: 102 U/L (ref 50–136)
Anion Gap: 7 (ref 7–16)
Bilirubin,Total: 0.7 mg/dL (ref 0.2–1.0)
Calcium, Total: 8.4 mg/dL — ABNORMAL LOW (ref 8.5–10.1)
Co2: 27 mmol/L (ref 21–32)
Creatinine: 1.49 mg/dL — ABNORMAL HIGH (ref 0.60–1.30)
EGFR (Non-African Amer.): 41 — ABNORMAL LOW
Glucose: 138 mg/dL — ABNORMAL HIGH (ref 65–99)
Osmolality: 295 (ref 275–301)
Potassium: 3.8 mmol/L (ref 3.5–5.1)
SGPT (ALT): 43 U/L (ref 12–78)

## 2011-12-14 LAB — MAGNESIUM: Magnesium: 2 mg/dL

## 2011-12-14 LAB — AMMONIA: Ammonia, Plasma: 31 mcmol/L (ref 11–32)

## 2011-12-15 LAB — BASIC METABOLIC PANEL
Anion Gap: 7 (ref 7–16)
BUN: 27 mg/dL — ABNORMAL HIGH (ref 7–18)
Calcium, Total: 8.4 mg/dL — ABNORMAL LOW (ref 8.5–10.1)
EGFR (African American): 50 — ABNORMAL LOW
EGFR (Non-African Amer.): 43 — ABNORMAL LOW
Glucose: 112 mg/dL — ABNORMAL HIGH (ref 65–99)
Osmolality: 295 (ref 275–301)
Potassium: 3.4 mmol/L — ABNORMAL LOW (ref 3.5–5.1)

## 2011-12-15 LAB — CBC WITH DIFFERENTIAL/PLATELET
Basophil #: 0.1 10*3/uL (ref 0.0–0.1)
Eosinophil #: 0.6 10*3/uL (ref 0.0–0.7)
HCT: 26.5 % — ABNORMAL LOW (ref 40.0–52.0)
HGB: 8.8 g/dL — ABNORMAL LOW (ref 13.0–18.0)
Lymphocyte %: 6 %
MCH: 29.1 pg (ref 26.0–34.0)
MCHC: 33.1 g/dL (ref 32.0–36.0)
Monocyte #: 0.8 x10 3/mm (ref 0.2–1.0)
Neutrophil #: 11.3 10*3/uL — ABNORMAL HIGH (ref 1.4–6.5)
Neutrophil %: 83.4 %
Platelet: 379 10*3/uL (ref 150–440)
RBC: 3.01 10*6/uL — ABNORMAL LOW (ref 4.40–5.90)
RDW: 13.8 % (ref 11.5–14.5)

## 2011-12-15 LAB — CULTURE, BLOOD (SINGLE)

## 2011-12-16 LAB — CBC WITH DIFFERENTIAL/PLATELET
Basophil #: 0.1 10*3/uL (ref 0.0–0.1)
Basophil %: 0.5 %
HCT: 27.2 % — ABNORMAL LOW (ref 40.0–52.0)
HGB: 9.3 g/dL — ABNORMAL LOW (ref 13.0–18.0)
Lymphocyte #: 0.5 10*3/uL — ABNORMAL LOW (ref 1.0–3.6)
Lymphocyte %: 5.2 %
MCH: 29.9 pg (ref 26.0–34.0)
MCHC: 34.3 g/dL (ref 32.0–36.0)
MCV: 87 fL (ref 80–100)
Monocyte %: 6.2 %
Neutrophil #: 8.5 10*3/uL — ABNORMAL HIGH (ref 1.4–6.5)
RBC: 3.12 10*6/uL — ABNORMAL LOW (ref 4.40–5.90)
RDW: 13.7 % (ref 11.5–14.5)
WBC: 10.2 10*3/uL (ref 3.8–10.6)

## 2011-12-16 LAB — BASIC METABOLIC PANEL
Anion Gap: 8 (ref 7–16)
Calcium, Total: 8.5 mg/dL (ref 8.5–10.1)
Chloride: 109 mmol/L — ABNORMAL HIGH (ref 98–107)
Co2: 26 mmol/L (ref 21–32)
Creatinine: 1.26 mg/dL (ref 0.60–1.30)
EGFR (African American): 58 — ABNORMAL LOW
Osmolality: 291 (ref 275–301)

## 2011-12-20 ENCOUNTER — Encounter: Payer: Self-pay | Admitting: Internal Medicine

## 2012-01-17 LAB — URINALYSIS, COMPLETE
Bilirubin,UR: NEGATIVE
Glucose,UR: NEGATIVE mg/dL (ref 0–75)
Ketone: NEGATIVE
Nitrite: POSITIVE
RBC,UR: 12 /HPF (ref 0–5)
Specific Gravity: 1.018 (ref 1.003–1.030)

## 2012-01-17 LAB — BASIC METABOLIC PANEL
Anion Gap: 10 (ref 7–16)
BUN: 36 mg/dL — ABNORMAL HIGH (ref 7–18)
Co2: 29 mmol/L (ref 21–32)
Creatinine: 1.3 mg/dL (ref 0.60–1.30)
EGFR (African American): 56 — ABNORMAL LOW
EGFR (Non-African Amer.): 48 — ABNORMAL LOW

## 2012-01-17 LAB — CBC WITH DIFFERENTIAL/PLATELET
Basophil #: 0.1 10*3/uL (ref 0.0–0.1)
Basophil %: 0.6 %
Eosinophil %: 0.8 %
HCT: 32.6 % — ABNORMAL LOW (ref 40.0–52.0)
HGB: 11 g/dL — ABNORMAL LOW (ref 13.0–18.0)
Lymphocyte #: 0.5 10*3/uL — ABNORMAL LOW (ref 1.0–3.6)
Lymphocyte %: 5.8 %
MCHC: 33.9 g/dL (ref 32.0–36.0)
MCV: 90 fL (ref 80–100)
Monocyte %: 8.3 %
Neutrophil #: 7.1 10*3/uL — ABNORMAL HIGH (ref 1.4–6.5)
RDW: 15.6 % — ABNORMAL HIGH (ref 11.5–14.5)

## 2012-01-19 ENCOUNTER — Encounter: Payer: Self-pay | Admitting: Internal Medicine

## 2012-11-04 ENCOUNTER — Emergency Department: Payer: Self-pay | Admitting: Emergency Medicine

## 2012-11-04 LAB — COMPREHENSIVE METABOLIC PANEL
Albumin: 3.4 g/dL (ref 3.4–5.0)
Alkaline Phosphatase: 109 U/L (ref 50–136)
BUN: 26 mg/dL — ABNORMAL HIGH (ref 7–18)
Bilirubin,Total: 0.3 mg/dL (ref 0.2–1.0)
Calcium, Total: 9.3 mg/dL (ref 8.5–10.1)
Chloride: 109 mmol/L — ABNORMAL HIGH (ref 98–107)
EGFR (African American): 56 — ABNORMAL LOW
EGFR (Non-African Amer.): 48 — ABNORMAL LOW
Potassium: 3.8 mmol/L (ref 3.5–5.1)
SGOT(AST): 17 U/L (ref 15–37)
SGPT (ALT): 14 U/L (ref 12–78)
Sodium: 143 mmol/L (ref 136–145)
Total Protein: 6.9 g/dL (ref 6.4–8.2)

## 2012-11-04 LAB — URINALYSIS, COMPLETE
Bilirubin,UR: NEGATIVE
Blood: NEGATIVE
Glucose,UR: NEGATIVE mg/dL (ref 0–75)
Ketone: NEGATIVE
Specific Gravity: 1.015 (ref 1.003–1.030)
Squamous Epithelial: NONE SEEN
WBC UR: 71 /HPF (ref 0–5)

## 2012-11-04 LAB — CBC WITH DIFFERENTIAL/PLATELET
Basophil %: 1.9 %
Eosinophil #: 0.3 10*3/uL (ref 0.0–0.7)
Eosinophil %: 4.5 %
HCT: 36.5 % — ABNORMAL LOW (ref 40.0–52.0)
HGB: 12.5 g/dL — ABNORMAL LOW (ref 13.0–18.0)
Lymphocyte #: 0.9 10*3/uL — ABNORMAL LOW (ref 1.0–3.6)
Lymphocyte %: 16.3 %
MCH: 30 pg (ref 26.0–34.0)
MCHC: 34.1 g/dL (ref 32.0–36.0)
MCV: 88 fL (ref 80–100)
Neutrophil #: 3.8 10*3/uL (ref 1.4–6.5)
RDW: 13.7 % (ref 11.5–14.5)

## 2012-11-04 LAB — TROPONIN I: Troponin-I: <0.02 ng/mL

## 2012-11-04 LAB — CK TOTAL AND CKMB (NOT AT ARMC): CK, Total: 44 U/L (ref 35–232)

## 2012-12-09 ENCOUNTER — Inpatient Hospital Stay: Payer: Self-pay | Admitting: Internal Medicine

## 2012-12-09 LAB — HEPATIC FUNCTION PANEL A (ARMC)
Alkaline Phosphatase: 97 U/L (ref 50–136)
Bilirubin,Total: 0.4 mg/dL (ref 0.2–1.0)
SGPT (ALT): 14 U/L (ref 12–78)
Total Protein: 6.8 g/dL (ref 6.4–8.2)

## 2012-12-09 LAB — BASIC METABOLIC PANEL
Anion Gap: 8 (ref 7–16)
BUN: 31 mg/dL — ABNORMAL HIGH (ref 7–18)
Co2: 26 mmol/L (ref 21–32)
Creatinine: 1.55 mg/dL — ABNORMAL HIGH (ref 0.60–1.30)
EGFR (Non-African Amer.): 39 — ABNORMAL LOW
Glucose: 134 mg/dL — ABNORMAL HIGH (ref 65–99)
Potassium: 3.7 mmol/L (ref 3.5–5.1)
Sodium: 139 mmol/L (ref 136–145)

## 2012-12-09 LAB — URINALYSIS, COMPLETE
Bilirubin,UR: NEGATIVE
Glucose,UR: NEGATIVE mg/dL (ref 0–75)
Hyaline Cast: 4
Ketone: NEGATIVE
Ph: 5 (ref 4.5–8.0)
Protein: NEGATIVE
RBC,UR: 5 /HPF (ref 0–5)
Specific Gravity: 1.017 (ref 1.003–1.030)
Squamous Epithelial: NONE SEEN
WBC UR: 169 /HPF (ref 0–5)

## 2012-12-09 LAB — CBC
HCT: 31.8 % — ABNORMAL LOW (ref 40.0–52.0)
MCH: 30.8 pg (ref 26.0–34.0)
MCV: 88 fL (ref 80–100)
RBC: 3.6 10*6/uL — ABNORMAL LOW (ref 4.40–5.90)

## 2012-12-09 LAB — MAGNESIUM: Magnesium: 1.5 mg/dL — ABNORMAL LOW

## 2012-12-10 LAB — CBC WITH DIFFERENTIAL/PLATELET
Basophil #: 0 10*3/uL (ref 0.0–0.1)
Basophil %: 0.2 %
Eosinophil #: 0.1 10*3/uL (ref 0.0–0.7)
HCT: 32.2 % — ABNORMAL LOW (ref 40.0–52.0)
HGB: 10.9 g/dL — ABNORMAL LOW (ref 13.0–18.0)
Lymphocyte #: 0.8 10*3/uL — ABNORMAL LOW (ref 1.0–3.6)
Lymphocyte %: 4.8 %
MCH: 30.5 pg (ref 26.0–34.0)
Monocyte #: 0.6 x10 3/mm (ref 0.2–1.0)
Neutrophil #: 14.2 10*3/uL — ABNORMAL HIGH (ref 1.4–6.5)
Neutrophil %: 90.9 %
Platelet: 279 10*3/uL (ref 150–440)
RBC: 3.59 10*6/uL — ABNORMAL LOW (ref 4.40–5.90)
RDW: 13.3 % (ref 11.5–14.5)
WBC: 15.7 10*3/uL — ABNORMAL HIGH (ref 3.8–10.6)

## 2012-12-10 LAB — MAGNESIUM: Magnesium: 2 mg/dL

## 2012-12-10 LAB — BASIC METABOLIC PANEL
Anion Gap: 8 (ref 7–16)
BUN: 27 mg/dL — ABNORMAL HIGH (ref 7–18)
Co2: 27 mmol/L (ref 21–32)
Creatinine: 1.42 mg/dL — ABNORMAL HIGH (ref 0.60–1.30)
EGFR (African American): 50 — ABNORMAL LOW
EGFR (Non-African Amer.): 43 — ABNORMAL LOW
Osmolality: 286 (ref 275–301)
Potassium: 3.8 mmol/L (ref 3.5–5.1)
Sodium: 141 mmol/L (ref 136–145)

## 2012-12-11 LAB — CREATININE, SERUM
Creatinine: 1.2 mg/dL (ref 0.60–1.30)
EGFR (Non-African Amer.): 53 — ABNORMAL LOW

## 2012-12-11 LAB — VANCOMYCIN, TROUGH: Vancomycin, Trough: 14 ug/mL (ref 10–20)

## 2012-12-14 LAB — CULTURE, BLOOD (SINGLE)

## 2012-12-14 LAB — PLATELET COUNT: Platelet: 402 10*3/uL (ref 150–440)

## 2013-03-25 ENCOUNTER — Other Ambulatory Visit: Payer: Self-pay | Admitting: Family Medicine

## 2013-03-25 LAB — CREATININE, SERUM: EGFR (African American): >60

## 2013-07-06 ENCOUNTER — Emergency Department: Payer: Self-pay | Admitting: Emergency Medicine

## 2013-09-25 ENCOUNTER — Emergency Department: Payer: Self-pay | Admitting: Emergency Medicine

## 2013-10-18 DEATH — deceased

## 2013-10-20 ENCOUNTER — Inpatient Hospital Stay: Payer: Self-pay | Admitting: Family Medicine

## 2013-10-20 LAB — COMPREHENSIVE METABOLIC PANEL
ALBUMIN: 2.8 g/dL — AB (ref 3.4–5.0)
ALK PHOS: 154 U/L — AB
ALT: 23 U/L (ref 12–78)
ANION GAP: 7 (ref 7–16)
BUN: 26 mg/dL — AB (ref 7–18)
Bilirubin,Total: 0.4 mg/dL (ref 0.2–1.0)
CALCIUM: 10.2 mg/dL — AB (ref 8.5–10.1)
Chloride: 110 mmol/L — ABNORMAL HIGH (ref 98–107)
Co2: 34 mmol/L — ABNORMAL HIGH (ref 21–32)
Creatinine: 1.16 mg/dL (ref 0.60–1.30)
GFR CALC NON AF AMER: 55 — AB
GLUCOSE: 145 mg/dL — AB (ref 65–99)
Osmolality: 307 (ref 275–301)
POTASSIUM: 3.8 mmol/L (ref 3.5–5.1)
SGOT(AST): 31 U/L (ref 15–37)
Sodium: 151 mmol/L — ABNORMAL HIGH (ref 136–145)
Total Protein: 7.8 g/dL (ref 6.4–8.2)

## 2013-10-20 LAB — CBC
HCT: 45.3 % (ref 40.0–52.0)
HGB: 14.3 g/dL (ref 13.0–18.0)
MCH: 28.9 pg (ref 26.0–34.0)
MCHC: 31.6 g/dL — AB (ref 32.0–36.0)
MCV: 92 fL (ref 80–100)
Platelet: 297 10*3/uL (ref 150–440)
RBC: 4.95 10*6/uL (ref 4.40–5.90)
RDW: 14.4 % (ref 11.5–14.5)
WBC: 11.9 10*3/uL — ABNORMAL HIGH (ref 3.8–10.6)

## 2013-10-20 LAB — PROTIME-INR
INR: 1
Prothrombin Time: 13.4 secs (ref 11.5–14.7)

## 2013-10-20 LAB — LIPASE, BLOOD: Lipase: 118 U/L (ref 73–393)

## 2013-10-21 LAB — CBC WITH DIFFERENTIAL/PLATELET
BASOS ABS: 0 10*3/uL (ref 0.0–0.1)
BASOS PCT: 0.2 %
BASOS PCT: 0.1 %
Basophil #: 0 10*3/uL (ref 0.0–0.1)
EOS ABS: 0 10*3/uL (ref 0.0–0.7)
EOS PCT: 0 %
Eosinophil #: 0 10*3/uL (ref 0.0–0.7)
Eosinophil %: 0.1 %
HCT: 41.7 % (ref 40.0–52.0)
HCT: 37.4 % — ABNORMAL LOW (ref 40.0–52.0)
HGB: 13.2 g/dL (ref 13.0–18.0)
HGB: 12.1 g/dL — AB (ref 13.0–18.0)
LYMPHS ABS: 0.2 10*3/uL — AB (ref 1.0–3.6)
Lymphocyte #: 0.4 10*3/uL — ABNORMAL LOW (ref 1.0–3.6)
Lymphocyte %: 1.2 %
Lymphocyte %: 1.8 %
MCH: 28.7 pg (ref 26.0–34.0)
MCH: 29.5 pg (ref 26.0–34.0)
MCHC: 31.5 g/dL — AB (ref 32.0–36.0)
MCHC: 32.3 g/dL (ref 32.0–36.0)
MCV: 91 fL (ref 80–100)
MCV: 91 fL (ref 80–100)
MONO ABS: 0.6 x10 3/mm (ref 0.2–1.0)
MONO ABS: 0.6 x10 3/mm (ref 0.2–1.0)
MONOS PCT: 3.2 %
MONOS PCT: 3 %
NEUTROS ABS: 19.2 10*3/uL — AB (ref 1.4–6.5)
Neutrophil #: 17.5 10*3/uL — ABNORMAL HIGH (ref 1.4–6.5)
Neutrophil %: 95.3 %
Neutrophil %: 95.1 %
PLATELETS: 280 10*3/uL (ref 150–440)
PLATELETS: 278 10*3/uL (ref 150–440)
RBC: 4.58 10*6/uL (ref 4.40–5.90)
RBC: 4.09 10*6/uL — ABNORMAL LOW (ref 4.40–5.90)
RDW: 14.4 % (ref 11.5–14.5)
RDW: 14.8 % — ABNORMAL HIGH (ref 11.5–14.5)
WBC: 18.4 10*3/uL — ABNORMAL HIGH (ref 3.8–10.6)
WBC: 20.2 10*3/uL — AB (ref 3.8–10.6)

## 2013-10-21 LAB — URINALYSIS, COMPLETE
BLOOD: NEGATIVE
Bilirubin,UR: NEGATIVE
GLUCOSE, UR: NEGATIVE mg/dL (ref 0–75)
Ketone: NEGATIVE
Nitrite: POSITIVE
PROTEIN: NEGATIVE
Ph: 5 (ref 4.5–8.0)
SPECIFIC GRAVITY: 1.019 (ref 1.003–1.030)

## 2013-11-18 DEATH — deceased

## 2014-08-07 NOTE — Discharge Summary (Signed)
PATIENT NAME:  Guy Marshall, Guy Marshall MR#:  [id number] DATE OF BIRTH:  April 20, 1920  DATE OF ADMISSION:  12/10/2011 DATE OF DISCHARGE:  12/16/2011  Review the History and Physical by Dr. Nemiah CommanderKalisetti on admission.  Please note that I took care of the patient only on 08/28.  For daily progress, review [facility] clinical records.   PRESENTING COMPLAINT: Unresponsiveness.   DISCHARGE DIAGNOSES:  1. Altered mental status/encephalopathy, multifactorial. Likely due to effect of medication and dehydration, now improved back to baseline.  2. Hospital-acquired/community-acquired pneumonia, right lower lobe infiltrate. Completed a course of vancomycin and Zosyn while in the hospital.  3. Atrial fibrillation, status post pacemaker, on Cardizem.  4. Dementia.  5. Recent left hip open reduction and internal fixation for fracture. Physical therapy resumed.   MEDICATIONS ON DISCHARGE:  1. Docusate 240 p.o. at bedtime.  2. Ocuvite PreserVision 1 tablet daily.  3. Vitamin D3 2000 units p.o. daily.  4. Aricept 5 mg daily.  5. Seroquel 25 mg at bedtime.  6. Seroquel 25 mg p.o. q. 4 p.r.n. for agitation/psychotic features.  7. Zocor 40 mg at bedtime.  8. Lasix 40 mg p.o. daily.  9. Klor-Con 20 mEq p.o. daily.  10. Protonix 40 mg daily.  11. Aspirin 325 mg p.o. b.i.d.  12. Magnesium hydroxide 30 mL p.o. b.i.d. p.r.n.  13. Tylenol 650 p.o. q. 6 p.r.n. 14. Zoloft.  15. Diltiazem ER 240 mg p.o. daily.  16. Ferrous sulfate 325 mg p.o. daily with meals.   ACTIVITY: Physical therapy.   OXYGEN: 1 to 1.5 liters per minute p.r.n. to keep saturations greater than 92%.   DIET: Soft diet.   FOLLOWUP:  The patient will follow up with Dr. Deeann SaintHoward Miller as an outpatient in two weeks.  LABS AT DISCHARGE: Hemoglobin is 10.2, hemoglobin and hematocrit 9.3 and 27.2, platelet count is 403. Glucose 127, BUN 24, creatinine 1.26, sodium 143, potassium 3.9, chloride 109, bicarbonate 26, calcium 8.5.  Plasma ammonia is 31. Magnesium is  2. CT of the head without contrast shows no acute intracranial abnormality. Chest x-ray 08/23 shows mild congestive heart failure, pneumonia in the lung bases cannot be excluded. Cardiac enzymes- three sets are negative. Blood cultures negative in five days. Urinalysis negative for urinary tract infection. Urine culture is negative in 36 hours.   CODE STATUS: NO CODE, DO NOT RESUSCITATE.   HISTORY OF PRESENT ILLNESS: Guy Marshall is an 81 year old Caucasian gentleman who has a past medical history of atrial fibrillation status post pacemaker, mild dementia, and hyperlipidemia, and was discharged to rehab on 12/08/2011 after a left hip fracture repair. He was brought in for altered mental status/unresponsiveness. He was admitted with:  1. Altered mental status: Appeared metabolic encephalopathy, likely due to the effect of medication versus dehydration and possible infection which is pneumonia. He was started on IV fluids. CT head did not show any acute changes. He was started on Provigil, received one dose and improved his mentation. The patient's Provigil has been discontinued at this time since he is on other psych medication which is high-dose Seroquel. His mentation is back to baseline.  2. Hospital-related pneumonia: The patient's chest x-ray on admission showed bibasilar infiltrate more on the right than the left. He was started on IV Zosyn and vancomycin for broader coverage. He completed the antibiotic course in the hospital. He is afebrile. White count is stable. He was seen by speech and no overt signs or symptoms of aspiration at this time. The patient will use oxygen on an  as-needed basis to keep saturations greater than 92%.  3. History of chronic atrial fibrillation, status post pacemaker: Continue Cardizem.  4. Dementia: The patient is back on his Aricept. 5. Recent left hip open reduction and internal fixation for fracture:  The patient's staples have been removed. He was seen by Dr. Hyacinth Meeker and  recommended to continue aspirin b.i.d.   6. Deep vein thrombosis prophylaxis: The patient was on Lovenox.  7. GI prophylaxis:  Protonix.  Hospital stay otherwise remained stable.   CODE STATUS: The patient remained a NO CODE, DO NOT RESUSCITATE.   TIME SPENT: 40 minutes.    ____________________________ Wylie Hail Allena Katz, MD sap:bjt D: 12/16/2011 13:22:35 ET T: 12/16/2011 14:00:16 ET JOB#: [id number]  cc: Jamica Woodyard A. Allena Katz, MD, <Dictator> Valinda Hoar, MD Willow Ora MD ELECTRONICALLY SIGNED 12/22/2011 15:44

## 2014-08-07 NOTE — Discharge Summary (Signed)
Marshall NAME:  Guy Marshall, Guy Marshall MR#:  [id number] DATE OF BIRTH:  1920-03-20  DATE OF ADMISSION:  12/02/2011 DATE OF DISCHARGE:  12/08/2011  FINAL DIAGNOSES:  1. Displaced subcapital fracture of [facility] left hip.  2. Mild dementia. 3. Atrial fibrillation with a pacemaker. No anticoagulants due to frequent falls. 4. Chronic pedal edema.  5. Depression.  6. Hyperlipidemia.  7. Urinary tract infection on admission   OPERATION: 12/03/2011 left hip hemiarthroplasty with a [employer]ryker Accolade prosthesis.   COMPLICATIONS: None.   CONSULTATION: [facility].   DISCHARGE MEDICATIONS:  1. Aricept 5 mg daily.  2. Trandate p.r.n. hypertension.  3. Multivitamin. 4. Zoloft 50 mg daily.  5. Zocor 40 mg at bedtime.  6. Lasix 40 mg daily. 7. Potassium 20 mEq b.i.d.  8. Cardizem CD 240 mg daily.  9. Levaquin 250 mg daily for 10 days.  10. Enteric-coated aspirin 1 p.o. b.i.d.  11. Iron 1 p.o. daily.  12. Norco 5/325 p.r.n. pain.   DIET: Low-fat, low-cholesterol 2 gram sodium.   HISTORY: Guy Marshall is an 81 year old male who fell at home after he had been to see Dr. Sullivan LoneGilbert and to [facility] hospital for a head CT scan. He complained of left leg pain and was brought to [facility] where exam and x-ray showed a displaced subcapital fracture of Guy left hip. He was admitted for medical evaluation and surgery. He was cleared by [facility] medical service for surgery. Other illnesses were kidney stones and sleep apnea. Surgical options were discussed with Guy Marshall and his wife and daughter. They wished to proceed with hip replacement surgery after discussion of risks and benefits.   PAST MEDICAL HISTORY/ILLNESSES: As above.   MEDICATIONS: As above.   ALLERGIES: Terazosin.   PAST SURGICAL HISTORY:  1. Right knee replacement. 2. Bilateral knee replacement.  3. Lithotripsy.  4. Appendectomy. 5. Pacemaker. 6. Rhinoplasty.  REVIEW OF SYSTEMS: Unremarkable.   FAMILY HISTORY: Unremarkable.   SOCIAL HISTORY:  Guy Marshall lives alone. Does not smoke or drink.   PHYSICAL EXAMINATION: Guy Marshall was alert but somewhat confused. Vital signs were normal. Left leg was shortened and rotated. There was pain with movement. Neurovascular status was good distally. No other orthopedic injuries were noted.   LABORATORY DATA: Laboratory data on admission was satisfactory.   HOSPITAL COURSE: On 12/03/2011 Guy Marshall underwent a left hip hemiarthroplasty with an Accolade prosthesis. Postoperatively, he did well. Hemoglobin remained in satisfactory range, dropping down only to 9.1 after several days. He was gradually mobilized. Wound remained benign. Medical service continued to follow him. He did require some nasal oxygen. He was stable and ready for skilled nursing discharge on 12/08/2011. He will be seen in my office in two weeks for exam and x-rays.    ____________________________ Valinda HoarHoward E. Deepa Barthel, MD hem:ap D: 12/08/2011 11:11:49 ET T: 12/08/2011 11:35:22 ET JOB#: [id number]  cc: Valinda HoarHoward E. Male Minish, MD, <Dictator> Richard L. Sullivan LoneGilbert, MD Valinda HoarHOWARD E Lestine Rahe MD ELECTRONICALLY SIGNED 12/08/2011 17:09

## 2014-08-07 NOTE — Consult Note (Signed)
PATIENT NAME:  Guy Marshall, Guy Marshall MR#:  [id number] DATE OF BIRTH:  1920-04-10  DATE OF CONSULTATION:  12/02/2011  REFERRING PHYSICIAN:  Deeann SaintHoward Miller, MD CONSULTING PHYSICIAN:  Enid Baasadhika Brynja Marker, MD  ADMITTING PHYSICIAN: Deeann SaintHoward Miller, MD  PRIMARY CARE PHYSICIAN: Julieanne Mansonichard Gilbert, MD  REASON FOR CONSULTATION: Preop evaluation for left hip fracture surgery.   BRIEF HISTORY: Guy Marshall is a pleasant 81 year old Caucasian male with past medical history significant for dementia, hyperlipidemia, chronic lower extremity edema, atrial fibrillation, and sick sinus syndrome status post pacemaker brought in after he had a fall and x-ray showing a left hip fracture. The patient has been having issues with his balance over the past year and has had multiple falls at home. This morning he was on his way to see his PCP and had a fall while sliding into the car. After the fall he complained of some knee pain, went to see Dr. Sullivan LoneGilbert, had a CT of his head which was negative from the fall, but because of worsening pain and unable to bear weight on his left side, he was brought into the ED and x-ray shows left hip fracture. Other than history of atrial fibrillation, he does not have any other significant cardiac history, no stents put in. He walks at home without any troubles with dyspnea or chest pain. His gait is mostly limited due to balance issues not due to cardiac or respiratory issues.   PAST MEDICAL HISTORY:  1. Atrial fibrillation and sick sinus syndrome status post pacemaker placement. 2. Hyperlipidemia. 3. Chronic lower extremity edema.  4. Depression.  5. Hyperlipidemia.  6. Dementia.   PAST SURGICAL HISTORY:  1. Bilateral knee surgery.  2. Pacemaker placement.  3. Rhinoplasty.  4. Appendectomy.  5. Lithotripsy.   ALLERGIES: Terazosin.   HOME MEDICATIONS: 1. Aspirin 81 mg p.o. daily.  2. Centrum Silver multivitamin 1 tablet p.o. daily.  3. Diltiazem 180 mg p.o. daily.  4. Donepezil 5 mg p.o.  daily.  5. Lasix 40 mg p.o. daily.  6. Sertraline 50 mg p.o. daily. 7. Simvastatin 40 mg p.o. at bedtime.  SOCIAL HISTORY: He lives at home with his wife. No smoking or alcohol use.   FAMILY HISTORY: Father died from heart disease.  REVIEW OF SYSTEMS: CONSTITUTIONAL: No fever, fatigue, weight loss or weight gain. EYES: Complains of blurred vision. No glaucoma or inflammation. ENT: Positive for hearing loss and uses hearing aids in both ears. No dysphagia, tinnitus, or ear discharge. RESPIRATORY: No cough, wheeze, hemoptysis, or chronic obstructive pulmonary disease. CARDIOVASCULAR: Positive for arrhythmia. No palpitations, syncope, or chest pain. GI: No nausea, vomiting, diarrhea, hematemesis, or melena. GENITOURINARY: No dysuria, hematuria, renal calculus, frequency, or incontinence. ENDOCRINE: No polyuria, nocturia, thyroid problems, or heat or cold intolerance. HEMATOLOGY: No anemia, easy bruising or bleeding. SKIN: No acne, rash, or lesions. MUSCULOSKELETAL: No neck pain or back pain. Positive for left hip pain. NEUROLOGICAL: No cerebrovascular accident, transient ischemic attack, or seizures. PSYCHOLOGIC: No anxiety, insomnia, or depression.   PHYSICAL EXAMINATION:   VITAL SIGNS: Temperature 98.2 degrees Fahrenheit, pulse 78, respirations 18, blood pressure 133/76, and pulse oximetry 96% on room air.   GENERAL: Elderly male, well built and well nourished, lying in bed, not in any acute distress.   HEENT: Normocephalic, atraumatic. Pupils equal, round, and reacting to light. Anicteric sclerae. Extraocular movements intact. Oropharynx clear without erythema, mass, or exudates.   NECK: Supple. No thyromegaly, JVD, or carotid bruits. No lymphadenopathy.   LUNGS: Clear to auscultation bilaterally. No wheeze or  crackles. No use of accessory muscles for breathing.   CARDIOVASCULAR: S1 and S2 regular rhythm, controlled rate. 2/6 systolic murmur. No rubs or gallops.   ABDOMEN: Soft, nontender,  and nondistended. No hepatosplenomegaly. Normal bowel sounds.   EXTREMITIES: Has 2+ pedal edema bilaterally in lower extremities. 1+ dorsalis pedis pulses palpable bilaterally.   SKIN: No acne, rash, or lesions.   LYMPHATICS: No cervical lymphadenopathy.   NEUROLOGIC: Cranial nerves intact. No focal motor or sensory deficits.   PSYCHOLOGICAL: The patient is alert, awake, and oriented x2.   LABORATORY, DIAGNOSTIC AND RADIOLOGIC DATA: WBC 7.5, hemoglobin 11.8, hematocrit 34.4, and platelet count 249.   Sodium 142, potassium 4.5, chloride 108, bicarbonate 28, BUN 25, creatinine 1.1, glucose 91, and calcium 9.0.   ALT 32, AST 32, alkaline phosphatase 87, total bilirubin 0.7, and albumin 3.2. Troponin less than 0.02. INR 1.0.   CT of the head without contrast after the fall is showing no evidence of acute ischemic or hemorrhagic infarction. Age-related atrophic changes are present, but is quite stable.   Urinalysis is showing 1+ leukocyte esterase, 5 WBCs, and no bacteria seen.  Left hip x-ray is showing impacted femoral neck fracture.  Pelvis x-ray is showing subcapital femoral neck fracture with component of impaction, possible intertrochanteric neck extension.   EKG is showing paced rhythm and irregular rhythm and atrial fibrillation with some PVCs.   Chest x-ray is done and is pending at this time.  ASSESSMENT AND RECOMMENDATIONS: An 81 year old male with history of dementia, atrial fibrillation status post pacemaker, and chronic pedal edema admitted after a fall with left hip fracture. Medicine consult was requested for preoperative evaluation. 1. Preop evaluation. The patient is moderate risk for intermediate risk procedure with his history of arrhythmia and also his age. He does not have any active chest pain, rate is well controlled at this time, and no dyspnea symptoms. EKG is showing paced rhythm. Continue Cardizem and add beta blocker if needed for better heart rate and blood  pressure control. Okay to proceed with surgery as benefit exceeds the risks at this time.  2. Atrial fibrillation, status post pacemaker. Continue Cardizem and add beta blocker if needed. Due to his high risk for falls, the patient is high risk for long-term anticoagulation and follows with Dr. Gwen Pounds.  3. Chronic pedal edema. Continue Lasix.  4. Depression. Continue home medication.  5. Hyperlipemia. He is on simvastatin.  6. Left hip fracture. Probably surgery tomorrow. Management per orthopedics. Pain medications, postoperative physical therapy, and deep vein thrombosis prophylaxis.  7. Asymptomatic pyuria with some WBCs and 1+ leukocyte esterase noted in urine. Check cultures. If positive only then start antibiotics as the patient is asymptomatic, afebrile, and has normal WBC count at this time.   CODE STATUS: FULL CODE.   TIME SPENT ON CONSULTATION: 50 minutes. ____________________________ Enid Baas, MD rk:slb D: 12/02/2011 20:36:05 ET     T: 12/03/2011 08:48:19 ET        JOB#: [id number] cc: Enid Baas, MD, <Dictator> Richard L. Sullivan Lone, MD Valinda Hoar, MD Enid Baas MD ELECTRONICALLY SIGNED 12/06/2011 8:51

## 2014-08-07 NOTE — H&P (Signed)
Subjective/Chief Complaint Pain left leg    History of Present Illness 81 year old male has fallen several times recently, again this morning, and complains of left leg pain.  Had head ct scan this am and went home and fell again.  Brought to Emergency Room where exam and X-rays show a displaced subcapital fracture left hip.  Admitted for medical evaluation and surgery.  Spoke to his daughter Gay Filler on the phone just now and discussed treatment options. She agrees with surgery to replace the ball of the hip and will discuss further with family in am.  Risks and benefits of surgery were discussed at length including but not limited to infection, non union, nerve or blood vessed damage, non union, need for repeat surgery, blood clots and lung emboli, and death. Previously on blood thinners but stopped due to his falls.  Has some dementia.   Past Med/Surgical Hx:  arthritis:   kidney stones:   sleep apnea before nose surgery:   LE edema:   hypertension:   a-fib:   right total knee replacement:   rhinoplasty:   lithotripsy:   appendectomy:   pacemaker:   ALLERGIES:  Terazosin HCl: Dizzy/Fainting  Family and Social History:   Family History Non-Contributory    Social History negative tobacco, negative ETOH    Place of Living Home   Review of Systems:   Fever/Chills No    Cough No    Sputum No   Physical Exam:   GEN well developed, well nourished, no acute distress    HEENT pink conjunctivae    NECK supple    RESP normal resp effort    CARD irregular rate    ABD denies tenderness    GU foley catheter in place    LYMPH negative neck    EXTR negative edema, left leg short and rotated.  circulation/sensation/motor function good and skin intact.  pain with range of motion of hip.    SKIN normal to palpation    NEURO motor/sensory function intact    PSYCH alert, poor insight   Lab Results: Hepatic:  14-Aug-13 15:02    Bilirubin, Total 0.7   Alkaline  Phosphatase 87   SGPT (ALT) 32   SGOT (AST) 32   Total Protein, Serum 7.1   Albumin, Serum  3.2  Routine BB:  14-Aug-13 18:34    ABO Group + Rh Type A Positive   Antibody Screen NEGATIVE (Result(s) reported on 02 Dec 2011 at 08:00PM.)  Routine Chem:  14-Aug-13 15:02    Glucose, Serum 91   BUN  25   Creatinine (comp) 1.10   Sodium, Serum 142   Potassium, Serum 4.5   Chloride, Serum  108   CO2, Serum 28   Calcium (Total), Serum 9.0   Osmolality (calc) 287   eGFR (African American) >60   eGFR (Non-African American)  59 (eGFR values <31m/min/1.73 m2 may be an indication of chronic kidney disease (CKD). Calculated eGFR is useful in patients with stable renal function. The eGFR calculation will not be reliable in acutely ill patients when serum creatinine is changing rapidly. It is not useful in  patients on dialysis. The eGFR calculation may not be applicable to patients at the low and high extremes of body sizes, pregnant women, and vegetarians.)   Anion Gap  6  Cardiac:  14-Aug-13 15:02    Troponin I < 0.02 (0.00-0.05 0.05 ng/mL or less: NEGATIVE  Repeat testing in 3-6 hrs  if clinically indicated. >0.05 ng/mL:  POTENTIAL  MYOCARDIAL INJURY. Repeat  testing in 3-6 hrs if  clinically indicated. NOTE: An increase or decrease  of 30% or more on serial  testing suggests a  clinically important change)  Routine UA:  14-Aug-13 16:53    Color (UA) Yellow   Clarity (UA) Clear   Glucose (UA) Negative   Bilirubin (UA) Negative   Ketones (UA) Negative   Specific Gravity (UA) 1.023   Blood (UA) Negative   pH (UA) 5.0   Protein (UA) Negative   Nitrite (UA) Negative   Leukocyte Esterase (UA) 1+ (Result(s) reported on 02 Dec 2011 at 05:15PM.)   RBC (UA) 2 /HPF   WBC (UA) 5 /HPF   Bacteria (UA) NONE SEEN   Epithelial Cells (UA) <1 /HPF   Mucous (UA) PRESENT (Result(s) reported on 02 Dec 2011 at 05:15PM.)  Routine Coag:  14-Aug-13 15:02    Prothrombin 13.9   INR 1.0 (INR  reference interval applies to patients on anticoagulant therapy. A single INR therapeutic range for coumarins is not optimal for all indications; however, the suggested range for most indications is 2.0 - 3.0. Exceptions to the INR Reference Range may include: Prosthetic heart valves, acute myocardial infarction, prevention of myocardial infarction, and combinations of aspirin and anticoagulant. The need for a higher or lower target INR must be assessed individually. Reference: The Pharmacology and Management of the Vitamin K  antagonists: the seventh ACCP Conference on Antithrombotic and Thrombolytic Therapy. GYFVC.9449 Sept:126 (3suppl): [id number]. A HCT value >55% may artifactually increase the PT.  In one study,  the increase was an average of 25%. Reference:  "Effect on Routine and Special Coagulation Testing Values of Citrate Anticoagulant Adjustment in Patients with High HCT Values." American Journal of Clinical Pathology 2006;126:400-405.)  Routine Hem:  14-Aug-13 15:02    WBC (CBC) 7.5   RBC (CBC)  3.87   Hemoglobin (CBC)  11.8   Hematocrit (CBC)  34.4   Platelet Count (CBC) 249 (Result(s) reported on 02 Dec 2011 at 03:58PM.)   MCV 89   MCH 30.6   MCHC 34.3   RDW 13.8   Radiology Results: XRay:    14-Aug-13 17:38, Hip Left Complete   Hip Left Complete   PRELIMINARY REPORT    The following is a PRELIMINARY Radiology report.  A final report will follow pending radiologist verification.      REASON FOR EXAM:    fall hip pain  COMMENTS:       PROCEDURE: DXR - DXR HIP LEFT COMPLETE  - Dec 02 2011  5:38PM     RESULT:     Findings: A femoral neck fracture is appreciated demonstrating a   component of angulation and mild lateralization.    IMPRESSION:      Impacted femoral neck fracture.    Thank you for the opportunity to contribute to the care of your patient.     Dictated By: Mikki Santee, M.D., MD     Assessment/Admission Diagnosis Displaced left  subcapital hip fracture    Plan left hip hemiarthroplasty in am.   Electronic Signatures: Park Breed (MD)  (Signed [phone] 20:56)  Authored: CHIEF COMPLAINT and HISTORY, PAST MEDICAL/SURGIAL HISTORY, ALLERGIES, FAMILY AND SOCIAL HISTORY, REVIEW OF SYSTEMS, PHYSICAL EXAM, LABS, Radiology, ASSESSMENT AND PLAN   Last Updated: 14-Aug-13 20:56 by Park Breed (MD)

## 2014-08-07 NOTE — Op Note (Signed)
PATIENT NAME:  Guy Marshall, Guy Marshall MR#:  [id number] DATE OF BIRTH:  [birth year]  DATE OF PROCEDURE:  12/03/2011  PREOPERATIVE DIAGNOSIS: Displaced subcapital fracture of the left hip.   POSTOPERATIVE DIAGNOSIS: Displaced subcapital fracture of the left hip.   PROCEDURE PERFORMED: Left hip hemiarthroplasty (Accolade #4.5 femoral stem, 52 mm unipolar head, +8 mm neck length).   SURGEON: Valinda HoarHoward E. Raynold Blankenbaker, M.D.   ANESTHESIA: Spinal.   COMPLICATIONS: None.   DRAINS: Two Hemovacs.   ESTIMATED BLOOD LOSS: 100 mL.  REPLACEMENTS: None.   DESCRIPTION OF PROCEDURE: The patient was brought to the Operating Room where he underwent satisfactory spinal anesthesia and was placed in a right lateral decubitus position and padded appropriately on the beanbag. The left hip was prepped and draped in sterile fashion and a posterolateral incision made. Dissection was carried out sharply through subcutaneous tissue and electrocautery used for hemostasis. The fascia was divided and a Charnley retractor inserted. The posterior capsule was exposed tagging the short external rotators. The capsule was cut in a T fashion and tagged. The fracture site was identified. The femoral neck was cut slightly and the femoral head then removed. It measured 52 mm in size. A trial 52 mm ball was inserted and fit well; a 53 did not fit as well. The femoral canal was then curetted and sequentially rasped up to a 4.5 size. The 4.5 rasp was left in place and a 52 mm head with a +4 neck length was inserted and reduced. This was in good position and had good stability with good leg length. These trials were removed and the wound was thoroughly irrigated. A #4.5 Accolade femoral stem was inserted. This seated a little more fully than the rasp had. We trialed +4 and +8 neck lengths and I felt that the +8 neck length was better. Therefore, a 52 mm unipolar head with a +8 neck length was applied and the hip was reduced. It was quite stable and leg lengths  were excellent. The wound was irrigated and the capsule closed with #2 Tycron. Short external rotators were closed with the same suture. The deep fascia was closed with 0 Vicryl over a Hemovac drain and the subcutaneous tissue was closed with 2-0 Vicryl over another Hemovac drain. The skin was closed with staples and a dry sterile dressing was applied. Hemovac was activated. The patient was carefully transferred to his hospital bed and taken to recovery in good condition. The hip was stable and leg lengths were good. ____________________________ Valinda HoarHoward E. Aadam Zhen, MD hem:slb D: 12/03/2011 13:37:10 ET T: 12/03/2011 13:48:39 ET JOB#: [id number]  cc: Valinda HoarHoward E. Mario Coronado, MD, <Dictator> Valinda HoarHOWARD E Bijan Ridgley MD ELECTRONICALLY SIGNED 12/04/2011 10:59

## 2014-08-07 NOTE — H&P (Signed)
PATIENT NAME:  Guy Marshall, [name]:  [id number] DATE OF BIRTH:  08/08/1919  DATE OF ADMISSION:  12/10/2011  ADMITTING PHYSICIAN: Enid Baasadhika Letcher Schweikert, MD  PRIMARY CARE PHYSICIAN: Julieanne Mansonichard Gilbert, MD (since the patient is at rehab Dr. Dareen PianoAnderson has been seeing him)  CHIEF COMPLAINT: Unresponsiveness.   HISTORY OF PRESENT ILLNESS: Mr. Guy Marshall is an 81 year old Caucasian male with past medical history significant for mild dementia, hyperlipidemia, depression, atrial fibrillation and sick sinus syndrome status post pacemaker placement who had a recent hospitalization at [facility] from 12/02/2011 to 12/08/2011 for a fall with left hip fracture and also had open reduction and internal fixation and was just discharged to [facility] on 12/08/2011. According to wife who is at bedside and gives most of the history, the patient was doing well at the time of discharge and was working with PT. All day yesterday he was a little restless. Confusion actually started yesterday. He has decreased interaction with the family but he was eating and drinking fine. It seems like Seroquel was started yesterday for p.r.n. psychosis and he also got one dose of Seroquel last night. According to rehab nurses, he slept well all night last night but had trouble waking up this morning. Even now at the time of my examination, he is just able to move his extremities withdrawing to pain but not opening eyes. He is snoring loudly and sleeping. He appears a little dehydrated on my exam. CT of the head is negative. Urinalysis is pending and chest x-ray is pending at this time. So he is being admitted for work-up of his altered mental status.   PAST MEDICAL HISTORY:  1. Atrial fibrillation and sick sinus syndrome status post pacemaker placement.  2. Depression.  3. Hyperlipidemia.  4. Mild dementia.  5. Chronic lower extremity edema.  PAST SURGICAL HISTORY:  1. Bilateral knee surgery.  2. Pacemaker placement.   3. Rhinoplasty.  4. Lithotripsy.  5. Appendectomy.  6. Left hip ORIF done on 12/03/2011.   ALLERGIES: Terazosin.   CURRENT MEDICATIONS AT REHAB: 1. Tylenol 650 mg p.o. every six hours p.r.n. for pain or fever.  2. Vicodin 5/325 mg 1 to 2 tablets every 4 to 6 hours p.r.n. for pain.  3. Aspirin 325 mg p.o. twice a day for anticoagulation.  4. Colace 240 mg capsule at bedtime.  5. Donepezil 5 mg p.o. at bedtime.  6. Ferrous sulfate 325 mg p.o. once a day with meals.  7. Lasix 40 mg p.o. daily.  8. Klor-Con twice a day for hypokalemia.  9. Levaquin 250 mg started on 12/08/2011 for three days.  10. Lonox 40 mg subcutaneous daily.  11. Milk of Magnesia p.r.n. for constipation twice a day. 12. Nystatin swish and swallow 5 mL four times daily for thrush.  13. Ocuvite PreserVision tablet once a day.  14. Seroquel 25 mg p.o. at bedtime, started yesterday.  15. Seroquel 25 mg p.o. every six hours p.r.n. for psychosis. 16. Zoloft 50 mg p.o. daily for depression.  17. Simvastatin 40 mg p.o. daily.  18. Tiazac 240 mg extended-release capsule once a day for hypertension.  19. Vitamin D3 2000 international units p.o. daily.   SOCIAL HISTORY: The patient was very living at home with wife prior to last hospitalization and two days ago he was discharged to [facility]. No smoking or alcohol use.   FAMILY HISTORY: Heart disease runs in the family. Both parents died from heart problems.   REVIEW OF SYSTEMS: Difficult to be obtained  secondary to the patient's mental status.   PHYSICAL EXAMINATION:   VITAL SIGNS: Temperature 97.6 degrees Fahrenheit, pulse 93, respirations 20, blood pressure 98/60, and pulse oximetry 94% on room air.   GENERAL: Well built, well nourished male lying in bed, not in any acute distress.   HEENT: Normocephalic, atraumatic. Pupils equal, round, and reacting to light. Anicteric sclerae. Nasopharynx is normal. Oropharynx with very dry tongue and also mucous membranes.  No erythema, mass, or exudates. No oral thrush is currently seen. The patient is mouth breathing and sleeping so his mouth appears much dry.  NECK: Supple. No thyromegaly, JVD, or carotid bruits. No lymphadenopathy.   LUNGS: Moving air bilaterally. Decreased bibasilar breath sounds. No wheeze or crackles. No use of accessory muscles for breathing.   CARDIOVASCULAR: S1 and S2 loud 3/6 systolic murmur heard across the precordial area. No rubs or gallops.   ABDOMEN: Soft, nontender, and nondistended. No hepatosplenomegaly. Normal bowel sounds.   EXTREMITIES: Chronic skin changes from prior pedal edema. Currently has 1+ pedal edema with feeble dorsalis pedis pulses. Left pulse was palpable better than the right side. No clubbing or cyanosis.   SKIN: No acne, rash, or lesions.   LYMPHATICS: No cervical or supraclavicular lymphadenopathy.   NEUROLOGIC: Difficult to do a neuro exam secondary to the patient being very unresponsive at this time. He is withdrawing to pain in all his four extremities and they are not flaccid or spastic at this time.   PSYCH: The patient is lethargic.   LABS/RADIOLOGIC STUDIES: WBC 10.6, hemoglobin 9.6, hematocrit 28.8, and platelet count 378.   Sodium 144, potassium 3.9, chloride 107, bicarbonate 30, BUN 26, creatinine 0.95, glucose 96, and calcium 8.4.   AST 58, ALT 54, alkaline phosphatase 90, total bilirubin 0.5, and albumin 2.2. Troponin less than 0.02. CK 47 and CK-MB 2.0.   CT of the head is showing no evidence of any acute ischemic or hemorrhagic infarction, age-related mild to moderate atrophic changes are present diffusely with mild compensatory ventriculomegaly and no intracranial mass affect is seen.   The patient did have an echocardiogram done in the process of admission which showed normal LV systolic function, ejection fraction 50 to 50%, and moderate concentric left ventricular hypertrophy is present. Dilated inferior vena cava with elevated  right-sided pressures. Small pericardial effusion is present.  EKG is showing paced rhythm with atrial fibrillation and heart rate of 100.   ASSESSMENT AND PLAN: An 81 year old male with past medical history of atrial fibrillation status post pacemaker, mild dementia, and hyperlipidemia who was just discharged to [facility] rehab on 12/08/2011 after left hip fracture repair brought in for unresponsiveness/altered mental status since morning.  1. Altered mental status, likely metabolic encephalopathy. Could be medication effect from the newly started Seroquel versus dehydration. The patient was started on Lasix due to pulmonary edema at the time of discharge two days ago. Also cannot rule out infection. Chest x-ray official report is pending at this time, but looking at the images it looks like the patient possibly has a developing infiltrate on the right side. His interstitial pattern is improved from his last chest x-ray. We will hold Seroquel, Zoloft and other pain medications for now. CT of the head is showing no acute changes.  2. Healthcare-acquired pneumonia. He has a right lower lobe infiltrate with mild pleural effusion. We will cover with broad-spectrum antibiotics, with vancomycin and Zosyn, at this time. Also follow blood cultures.  3. Atrial fibrillation, status post pacemaker. Had trouble with tachycardia on  last admission. Continue to monitor at this time and restart Cardizem once able to take p.o. 4. Dementia. Hold Aricept as the patient is unresponsive at this time.  5. Recent left hip open reduction and internal fixation. Physical therapy once mental status is better. Orthopedics has been consulted. He will be discharged to rehab once stable. 6. GI and DVT prophylaxis. On Protonix and also Lovenox.       CODE STATUS: DO NOT RESUSCITATE  TIME SPENT ON ADMISSION: 50 minutes. ____________________________ Enid Baas, MD rk:slb D: 12/10/2011 14:11:58 ET T: 12/10/2011 14:52:22  ET JOB#: [id number]  cc: Enid Baas, MD, <Dictator> Richard L. Sullivan Lone, MD Marya Amsler. Dareen Piano, MD Valinda Hoar, MD Enid Baas MD ELECTRONICALLY SIGNED 12/10/2011 17:55

## 2014-08-07 NOTE — Consult Note (Signed)
Brief Consult Note: Diagnosis: status post left hip hemiarthroplasty.   Patient was seen by consultant.   Recommend further assessment or treatment.   Orders entered.   Comments: Patient underwent a left hip hemiarthroplasty on 12/10/11 by Dr. Hyacinth MeekerMiller.  Patient is readmitted today with AMS changes.  Patient denies significant left hip pain.  On exam, his incision is C/D/I.  He has intact sensation to light touch in the left lower extremity and can flex and extend his toes and dorsiflex and plantarflex his ankle.  He has palpable pedal pulses.  I am ordering xrays of his left hip to assess component position.  I also recommend he use an abduction pillow while in bed to reduce dislocation risk early post-op.  Continue DSDs until incision dry.  PT consult for PWB on the left hip pending x-rays.  Electronic Signatures: Guy Marshall, Guy Marshall (MD)  (Signed [id number]-Aug-13 18:37)  Authored: Brief Consult Note   Last Updated: 22-Aug-13 18:37 by Guy Marshall, Connelly [place] (MD)

## 2014-08-07 NOTE — Consult Note (Signed)
Chief Complaint:   Subjective/Chief Complaint He is alert and comfortable.  Left hip stable.  circulation/sensation/motor function good and dressing dry.  Will remove staples today.  Put back on EC ASA  bid when he goes back to skilled nursing facility.  return to  clinic 2 weeks.   VITAL SIGNS/ANCILLARY NOTES: **Vital Signs.:   28-Aug-13 05:40   Vital Signs Type Routine   Temperature Temperature (F) 98   Celsius 36.6   Temperature Source Oral   Pulse Pulse 78   Respirations Respirations 18   Systolic BP Systolic BP 130   Diastolic BP (mmHg) Diastolic BP (mmHg) 67   Mean BP 88   Pulse Ox % Pulse Ox % 94   Pulse Ox Activity Level  At rest   Oxygen Delivery 2L   Brief Assessment:   Additional Physical Exam hip stable with good range of motion.  wound healed well.  circulation/sensation/motor function good   Assessment/Plan:  Assessment/Plan:   Assessment left hip stable    Plan partial weight bearing on walker return to  clinic 2 weeks   Electronic Signatures: Valinda HoarMiller, Tawyna Pellot E (MD)  (Signed 28-Aug-13 12:45)  Authored: Chief Complaint, VITAL SIGNS/ANCILLARY NOTES, Brief Assessment, Assessment/Plan   Last Updated: 28-Aug-13 12:45 by Valinda HoarMiller, Sophia Sperry E (MD)

## 2014-08-07 NOTE — Consult Note (Signed)
Brief Consult Note: Diagnosis: Atrial fib and sick sinus s/p pacemaker, dementia, chronic pedal edema, hyperlipidemia.   Patient was seen by consultant.   Consult note dictated.   Recommend to proceed with surgery or procedure.   Orders entered.   Comments: 81y/o M with dementia, afib s/p pacer, chronic pedal edema admitted fater a fall and left hip fracture. Medical consult requested for pre op eval  * Pre op eval- moderate risk for indermediate risk procedure no active chest pain or dyspnea symptoms. EKG with paced rhythm cont cardizem and better BP control ok to proceed with surgery at this time.  * Afib s/p pacer- cont cardizem, due to falls, high risk for long term anticoagulation follows with Dr. Gwen PoundsKowalski  * Chronic pedal edema- on lasix  * Depression- sertraline  * Hyperlipidemia- simvastatin  * UA with few wbcs adn 1+ LE, check cultures, if positive, add ABX  * left hip fracture- likely surgery tomorrow, mgmt per ortho post op PT adn DVT prophylaxis.  Electronic Signatures: Enid BaasKalisetti, Finnley Lewis (MD)  (Signed [phone]-Aug-13 20:28)  Authored: Brief Consult Note   Last Updated: 14-Aug-13 20:28 by Enid BaasKalisetti, Orla Estrin (MD)

## 2014-08-10 NOTE — H&P (Signed)
PATIENT NAME:  Guy Marshall, Guy Marshall MR#:  [id number] DATE OF BIRTH:  [birth year]  DATE OF ADMISSION:  12/09/2012  PRIMARY CARE PHYSICIAN: Julieanne Manson, M.D.    CHIEF COMPLAINT: Fever, confusion and low oxygen sats.   HISTORY OF PRESENTING ILLNESS: A 81 year old Caucasian male patient who is a resident of [facility] nursing home with history of atrial fibrillation, dementia, recurrent UTIs. Presents to the Emergency Room from [facility] where he was found to be confused with chills and fever of 103. The patient was found to have sats of 78% on room air. I called [facility] and his nurse mentioned that the patient normally talks 1 to 2 words. He uses p.r.n. oxygen. He was treated for bronchitis a week prior. Had a chest x-ray done which was normal as per his nurse. He was supposed to get a followup chest x-ray today which could not be done. Then, acutely he worsened with fever and low oxygen sats. Sent to the ER.   Here in the Emergency Room, the patient has been found to have a UTI on UA. Chest x-ray shows possible bilateral pneumonitis with pulmonary edema. He has had low blood pressure as low as 84/50. He does not open his eyes. Has continuous myoclonic jerks.   PAST MEDICAL HISTORY:  1. Atrial fibrillation and sick sinus syndrome, status post pacemaker placement.  2. Depression.  3. Hyperlipidemia.  4. Mild dementia.  5. Chronic lower extremity edema.  6. Recurrent UTIs.   PAST SURGICAL HISTORY: Bilateral knee surgery, pacemaker placement, rhinoplasty, lithotripsy, appendectomy, left hip surgery.   ALLERGIES: TERAZOSIN.   SOCIAL HISTORY: The patient is living at [facility] home. Does not smoke. No alcohol. No illicit drugs.   CODE STATUS: DNR/DNI.   FAMILY HISTORY: Heart disease runs in his family, and both parents died of heart problems as per old records.   REVIEW OF SYSTEMS: Unable to obtain secondary to the patient's encephalopathy.   HOME MEDICATIONS:  Include:  1. Aricept 5 mg oral once a day.  2. Aspirin 325 mg oral 2 times a day.  3. Ferrous sulfate 325 mg oral once a day.  4. Lasix 40 mg oral once a day.  5. Ocuvite PreserVision oral once a day.  6. Potassium chloride 20 mEq oral once a day.  7. Protonix 20 mg oral once a day.  8. Tiazac oral once a day, 240 mg.  9. Trazodone 150 mg oral once a day at bedtime.  10. Vitamin C 500 mg oral once a day.  11. Vitamin D3 2000 international units oral once a day.  12. Zocor 10 mg oral once a day.  13. Zoloft 50 mg oral once a day.   PHYSICAL EXAMINATION:  VITAL SIGNS: Temperature 101.4, pulse of 75, blood pressure 93/45 and as low as 84/60. Saturating 94% on 4 liters oxygen.  GENERAL: Elderly Caucasian male patient sitting up in bed, nonverbal. Has continuous myoclonic jerks.  HEENT: Atraumatic, normocephalic. Oral mucosa moist and pink. External ears and nose normal. No pallor. No icterus. Pupils bilaterally equal and reactive to light.  NECK: Supple. No thyromegaly. No palpable lymph nodes. Trachea midline. No carotid bruits. No JVD.  CARDIOVASCULAR: S1, S2 irregular. Systolic murmur. Peripheral pulses 2+. No edema.  RESPIRATORY: Bilateral coarse breath sounds.  GASTROINTESTINAL: Soft abdomen, nontender. Bowel sounds present. No organomegaly palpable.  SKIN: Warm and dry. No petechiae, rash, ulcers. Chronic changes in the lower extremities.  GENITOURINARY: No CVA tenderness or bladder distention.  MUSCULOSKELETAL: No  joint swelling, redness, effusion. Normal muscle tone.  NEUROLOGICAL: Babinskis downgoing. Does not follow commands.   LABORATORY STUDIES: Glucose 134. BNP of 1900. BUN 31, creatinine 1.55, sodium 139, potassium 3.7. GFR 39. AST, ALT, alkaline phosphatase normal. Troponin less than 0.02. WBC 15.2, hemoglobin 11.1, platelets of 266.   Urinalysis shows 169 WBCs and 3+ bacteria.   Lactic acid 2.2.   Chest x-ray shows bilateral pneumonitis versus pulmonary edema. No pleural  effusions or pneumothorax.   ASSESSMENT AND PLAN:  1. Healthcare-acquired pneumonia and urinary tract infection with severe sepsis. Will bolus 250 mL normal saline stat. Start the patient on intravenous fluids. Will also put him on broad-spectrum antibiotics of vancomycin, Zosyn and azithromycin. Send for blood and sputum cultures along with urine cultures. The patient is critically ill. Will be admitted to Critical Care Unit bed with hypotension. I suspect if there is any deterioration he will need to be started on vasopressors. Will wait for culture results.  2. Acute encephalopathy, likely from the infection, toxic metabolic. Will check a CT head to rule out any acute abnormalities, including stroke or intracranial bleed.  3. Acute renal failure over acute tubular necrosis due to sepsis. Intravenous fluids. Foley. Ins and outs. Will repeat labs in the morning.  4. Elevated BNP. The patient has had elevated BNP in the past all the way up to 5000. Today, it is 1900. His last echocardiogram was 2013 which showed no congestive heart failure. Presently, he does not have any edema, jugular venous distention. He will be on intravenous fluids for sepsis.  5. Deep vein thrombosis prophylaxis with subcutaneous heparin.   CODE STATUS: DNR/DNI.   Time spent today on this critically ill patient being admitted to CCU with severe sepsis and hypotension was greater than 75 minutes.    ____________________________ Molinda BailiffSrikar R. Alban Marucci, MD srs:gb D: 12/09/2012 02:34:30 ET T: 12/09/2012 02:46:35 ET JOB#: [id number]  cc: Wardell HeathSrikar R. Kalee Broxton, MD, <Dictator> Richard L. Sullivan LoneGilbert, MD Orie FishermanSRIKAR R Emmely Bittinger MD ELECTRONICALLY SIGNED 12/09/2012 12:36

## 2014-08-10 NOTE — Discharge Summary (Signed)
PATIENT NAME:  Guy Marshall, Guy Marshall MR#:  [id number] DATE OF BIRTH:  1920/01/09  DATE OF ADMISSION:  12/09/2012 DATE OF DISCHARGE:  12/14/2012   DISCHARGE DIAGNOSES:   1.  Sepsis, now resolved from extended-spectrum beta-lactamase urinary tract infection, no pneumonia.  2.  Extended-spectrum beta-lactamase urinary tract infection, on intravenous ertapenem which will need to be total 10 days' course. 3.  Acute encephalopathy, likely metabolic due to sepsis from urinary tract infection.  4.  Acute renal failure due to acute tubular necrosis/sepsis, now resolved.  5.  Hypomagnesemia, repleted and resolved.  6.  Leukocytosis, likely due to sepsis, now resolved. 7.  Aspiration concern, remains at high risk for recurrent aspiration, provided aspiration precaution counseling.  8.  Hypoxia, likely due to atelectasis, will need 2 liters oxygen via nasal cannula for now.   SECONDARY DIAGNOSES: 1.  Atrial fibrillation and sick sinus syndrome, status post pacemaker placement.  2.  Depression.  3.  Hyperlipidemia.  4.  History of dementia.  5.  Lower extremity edema.  6.  Recurrent urinary tract infection.   CONSULTATIONS: 1.  Speech therapy.  2.  Physical therapy.   PROCEDURES AND RADIOLOGY: CT scan of the head without contrast on the 22nd of August showed chronic sinusitis. No acute abnormalities.   Chest x-ray on the 22nd of August  showed shallow inspiration. Possible pulmonary edema. Prior healed rib fractures laterally on the left.   Chest x-ray on the 23rd of August showed possible density on the left, could be fluid or infiltrate.   Right hand x-ray on the 24th of August showed no definite acute fracture.   Chest x-ray on the 27th of August showed atelectasis versus infiltrate at the lung bases which is decreased in size.   Urinalysis on admission showed WBC in clumps, 3+ bacteria, 169 WBCs, 3+ leukocyte esterase.   Urine culture grew more than 100,000 colonies of ESBL E. coli.   Blood  cultures x 2 were negative.   HISTORY AND SHORT HOSPITAL COURSE: The patient is a 81 year old male with above-mentioned medical problems who was admitted for sepsis thought to be secondary to UTI. There was also concern for pneumonia on admission, which was ruled out subsequently on subsequent evaluation. The patient was evaluated by speech therapy and was found to be at high risk for aspiration, and the patient was followed throughout the course by speech therapy and was counseled on aspiration precautions and was started on nectar-thick liquids, which he tolerated it well. The patient had encephalopathy thought to be secondary to sepsis from underlying UTI, which was improved with antibiotics. He also had some renal failure, which was improved slowly with treatment of infection. He was close to his baseline, although he does remain at very high risk for recurrent UTI and sepsis with possible recurrent aspiration risk, and this was discussed with his son along with Guy Marshall (admission coordinator at [facility]), who is in agreement to have palliative care and hospice evaluation if this recurs, if the patient gets worse while here.   On the date of discharge, his vital signs are as follows: Temperature 98, heart rate 78 per minute, respiration 18 per minute, blood pressure 148/82 mmHg. He was saturating 94% on 2 liters oxygen via nasal cannula.   PERTINENT PHYSICAL EXAMINATION ON THE DATE OF DISCHARGE:  CARDIOVASCULAR: S1, S2 normal. No murmurs, rubs or gallops. LUNGS: Clear to auscultation bilaterally. No wheezing, rales, rhonchi or crepitation.  ABDOMEN: Soft, benign.  NEUROLOGIC: Nonfocal examination. He does  not follow much commands and keeps closing his eyes. He does grimace to pain and withdraws all.  All other physical examination remained at baseline.   DISCHARGE MEDICATIONS: 1.  Ferrous sulfate 325 mg p.o. daily.  2.  Ocuvite once daily.  3.  Aricept 5 mg p.o. daily.  4.   Lasix 40 mg p.o. daily.  5.  Potassium chloride 20 mEq p.o. daily.  6.  Protonix 40 mg p.o. daily.  7.  Trazodone 150 mg p.o. at bedtime.  8.  Tiazac 240 mg p.o. daily.  9.  Vitamin D3, 2000 international units once daily.  10.  Zocor 10 mg p.o. at bedtime. 11.  Zoloft 50 mg p.o. daily.  12.  Aspirin 325 mg p.o. b.i.d.  13.  Vitamin Marshall 500 mg p.o. daily. 14.  Ertapenem IV 1 gram daily for 5 more days.   DISCHARGE DIET: Low-sodium. Pureed with nectar-thin liquids with strict aspiration precautions. Feeding assistance at all meals. Monitoring with all p.o.'s. Medications in pureed, crushed as able. Moisten all foods with gravy/butter. Likes puddings and yogurts. Please provide Magic Cup with lunch and dinner all the time.   DISCHARGE ACTIVITY: As tolerated.   DISCHARGE INSTRUCTIONS AND FOLLOWUP: The patient was instructed to follow up with his primary care physician, Dr. Julieanne Manson, in 1 to 2 weeks. He will need to 2 liters oxygen by nasal cannula continuous for now considering he is hypoxic.  TIME SPENT: Total time discharging this patient was 55 minutes. He remains at very high risk for readmissions and rehospitalization and he should be monitored while at the nursing home for palliative care and hospice evaluation if his clinical condition worsens. This was communicated to the patient's son, Guy Marshall, and admission coordinator at [facility]).   ____________________________ Ellamae Sia. Sherryll Burger, MD vss:jm D: 12/14/2012 14:33:37 ET T: 12/14/2012 15:45:53 ET JOB#: [id number]  cc: Athelene Hursey S. Sherryll Burger, MD, <Dictator> Richard L. Sullivan Lone, MD Ellamae Sia [facility] MD ELECTRONICALLY SIGNED 12/15/2012 12:52

## 2014-08-11 NOTE — Discharge Summary (Signed)
PATIENT NAME:  Guy Marshall, Guy Marshall MR#:  [id number] DATE OF BIRTH:  30-Jan-1920  DATE OF ADMISSION:  10/20/2013 DATE OF DISCHARGE:  10/21/2013  REASON FOR ADMISSION: GI bleeding.   Please refer to H and P dictated by Dr. Dimas AguasHoward.   ADMISSION DIAGNOSIS: Gastrointestinal bleeding.   DISCHARGE DIAGNOSES:  1. Gastrointestinal bleeding with hematemesis.  2. Acute blood loss anemia.  3. Atrial fibrillation rate controlled.  4. Hypertension.  5. Stage II sacral decubitus.  6. Hypernatremia.  7. Dehydration.  8. Unresponsiveness.  9. Severe advanced dementia.  10. Leukocytosis and systemic inflammatory response syndrome.   PRESENT AT ADMISSION:    MEDICATIONS AT DISCHARGE: Mostly comfort medications as the patient is going to be discharged to comfort care with hospice. Tylenol suppositories as needed, morphine 0.25 mL of the oral solution as needed every hour for pain or anxiety, lorazepam 0.5 mg intramuscular as needed for signs of discomfort, atropine sublingual every hour as needed for secretions, scopolamine once a day.   Stop all other medications. Oxygen 2 liters nasal cannula for comfort only.   Followup with hospice today or tomorrow on the facility.  HOSPITAL COURSE: This is a nice 81 year old gentleman who has a history of severe and advanced dementia, hyperlipidemia, atrial fibrillation, hypertension, permanent pacemaker placement, sacral decubitus ulcers stage II, and pressure ulcer on the heel. The patient presented sent from his skilled nursing facility with advanced dementia and not able to answer any questions pretty much unresponsive. I have spoken with the family. This is his baseline. The patient remains unresponsive for most of the day, occasionally wakes up, but he does not communicate.   He developed an episode of hematemesis at the nursing facility for what he was brought up into the Emergency Department. In the Emergency Department, his vitals where checked. Here his blood  pressure has been as low as 82/60 and his heart rate has been as high as 92. He started developing a low-grade fever of 100.7 axillary and he is requiring 2 liters of oxygen.   The patient again is unresponsive even to painful stimulation. I spoke with the family at length about quality of life issues and what would they expect and want for his father and they agree that getting any procedures, getting any treatments at this moment would not be for the good of his father and they have set up to do hospice with his skilled nursing facility.   I spoke with them and they are okay sending him today. We are trying to work things out on our end with the social worker to get a transfer to the skilled nursing facility.   The patient again remains unresponsive. He is severely dehydrated, hypotensive. We are stopping Protonix drip. We are stopping all other medications and continue only comfort. He has systemic inflammatory response syndrome, likely sepsis. I ordered a urinalysis although that will not be necessary because of the change of plans. For his sepsis, he has leukocytosis of 20,000. He has tachycardia at 92 and altered mental status.   The patient is possibly having a silent aspiration versus a urinary tract infection.   No need for antibiotics as the patient's family requested comfort care. The patient is going to be transferred to his skilled nursing facility. Hospice is going to work with them.   TIME SPENT: I spent about 45 minutes with this patient today.    ____________________________ Felipa Furnaceoberto Sanchez Gutierrez, MD rsg:lt D: 10/21/2013 11:58:29 ET T: 10/22/2013 00:06:18 ET JOB#: [id number]  cc: Felipa Furnace, MD, <Dictator> Richard L. Sullivan Lone, MD Regan Rakers Juanda Chance MD ELECTRONICALLY SIGNED 11/02/2013 1:48

## 2014-08-11 NOTE — Consult Note (Signed)
Given the history of vomiting regular food followed by hematemesis he likely had a Mallory Weiss tear.  He has coarse ronchi on expiration and his WBC has gone up to 20K since admission yesterday.  May have aspiration pneumonia early stage, may have sepsis from urinary infection.  No indication for EGD at this time given the comfort only plan.  Electronic Signatures: Scot JunElliott, Cassaundra Rasch T (MD)  (Signed on 04-Jul-15 14:50)  Authored  Last Updated: 04-Jul-15 14:50 by Scot JunElliott, Aleece Loyd T (MD)

## 2014-08-11 NOTE — H&P (Signed)
PATIENT NAME:  Guy Marshall, [name] MR#:  [id number] DATE OF BIRTH:  Aug 20, 1919  DATE OF ADMISSION:  10/20/2013  REFERRING PHYSICIAN: Dr. Carollee MassedKaminski  PRIMARY CARE PHYSICIAN: Dr. Elease HashimotoMaloney  HISTORY OF PRESENT ILLNESS: A 81 year old gentleman with past medical history of advanced dementia, atrial fibrillation as well as hypertension, presenting with hematemesis. He is unable to provide any meaningful information, given baseline mental status and current medical condition. Per documentation, he developed hematemesis at his nursing facility. No preceding symptoms, in his usual state of health prior to this. Vomitus described originally as normal stomach content, followed by dark red emesis. He remains hemodynamically stable. Unable, once again, to provide any meaningful information given mental status.   REVIEW OF SYSTEMS: Unobtainable, given patient's baseline mental status.   PAST MEDICAL HISTORY: Advanced dementia, hyperlipidemia, atrial fibrillation, hypertension, permanent pacemaker placement, sacral decubitus ulcer, stage II, as well as pressure ulcer of the left heel.   SOCIAL HISTORY: No alcohol, tobacco, or drug usage. Currently resides at [facility].   FAMILY HISTORY: Unable to obtain, given the patient's mental status and medical condition.   ALLERGIES: TERAZOSIN.   HOME MEDICATIONS: Include aspirin 325 mg p.o. b.i.d., morphine 20 mg/mL 0.25 mL every hour as needed for pain, Tylenol 325 mg 2 tabs q.4h. as needed for pain or fever, diltiazem 240 mg p.o. daily, atropine to 1 to 4 drops sublingual every hour for secretions, lorazepam 0.5 mg every four hours as needed for pain and agitation, sertraline 50 mg p.o. daily, trazodone 100 mg p.o. at bedtime, Lasix 40 mg p.o. daily, potassium 20 mEq p.o. daily, Protonix 40 mg p.o. daily, vitamin C 500 mg p.o. b.i.d., UTI-Stat liquid 30 mL p.o. daily.   PHYSICAL EXAMINATION: VITAL SIGNS: Heart rate 83, respirations 18, blood pressure 131/109, saturating  97% on supplemental O2.  GENERAL: Chronically ill-appearing Caucasian gentleman, currently in no acute distress.  HEAD: Normocephalic, atraumatic.  EYES: Pupils equal, round, and reactive to light. Extraocular muscles intact. No scleral icterus.  MOUTH: Dry mucosal membranes. Poor dentition. No abscess noted. Some dried blood on his lips.  EARS, NOSE, AND THROAT: Clear, without exudates. No external lesions.  NECK: Supple. No thyromegaly. No nodules. No JVD.  PULMONARY: Clear to auscultation bilaterally without wheezes, rubs or rhonchi. No use of accessory muscles. Good respiratory effort.  CHEST: Nontender to palpation.  CARDIOVASCULAR: S1, S2. Irregular rate, regular rhythm. No murmurs, rubs, or gallops. No edema. Pedal pulses 2+ bilaterally.  GASTROINTESTINAL: Soft, nontender, nondistended. No masses. Positive bowel sounds. No hepatosplenomegaly.  MUSCULOSKELETAL: No swelling, clubbing, or edema. Passive range of motion full in all extremities.  NEUROLOGIC: Unable to fully assess, given patient's mental status. He is nonverbal at baseline and nonambulatory.  SKIN: No ulcerations, lesions, rashes, or cyanosis, other than the stage II sacral decubitus ulcer present on admission, as well as unstageable ulcer over the left heel, which currently is dressed. Skin warm, dry. Turgor poor.  PSYCHIATRIC: Unable to fully assess given patient's baseline mental status. He is essentially minimally responsive at baseline, nonverbal, nonambulatory, unable to interact in any meaningful fashion.   LABORATORY DATA: Sodium 151, potassium 3.8, chloride 110, bicarbonate 34, BUN 26, creatinine 1.16, glucose 145, albumin 2.8, alkaline phosphatase 154, otherwise within normal limits. WBC 11.9, hemoglobin 14.3, platelets of 297. Abdominal x-ray performed, possible partial distal small bowel obstruction or ileus. No perforation.   ASSESSMENT AND PLAN: A 81 year old gentleman with severe dementia, presenting with  hematemesis.  1.  Gastrointestinal bleed/hematemesis. Trend CBCs every 6 hours.  Transfusion threshold hemoglobin less than 7. We will start a Protonix drip at this time, will discontinue the octreotide drip as initiated by the Emergency Department staff with, as evidence of liver disease or varices, no indication for octreotide. We will consult gastroenterology.  2.  Atrial fibrillation remains rate controlled. Continue Cardizem.  3.  Hypertension. Will hold Lasix.  4.  Stage II sacral decubitus ulcer as well as unstageable left heel ulcer. We will consult wound therapy.  5.  Hypernatremia with free water deficit of around 2 liters. When able to tolerate oral intake, would recommend free water.  6.  Deep venous thrombosis prophylaxis with sequential compression devices.   CODE STATUS: The patient is do not resuscitate.   TIME SPENT: 45 minutes.   ____________________________ Cletis Athens. Takiya Belmares, MD dkh:cg D: 10/20/2013 22:29:16 ET T: 10/21/2013 00:19:12 ET JOB#: [id number]  cc: Cletis Athens. Rebeccah Ivins, MD, <Dictator> Annely Sliva Synetta Shadow MD ELECTRONICALLY SIGNED 10/21/2013 20:35

## 2014-08-11 NOTE — Consult Note (Signed)
PATIENT NAME:  Guy Marshall, Guy Marshall MR#:  [id number] DATE OF BIRTH:  [birth year]  DATE OF CONSULTATION:  10/21/2013  REFERRING PHYSICIAN:   CONSULTING PHYSICIAN:  Scot Junobert T. Corine Solorio, MD  HISTORY OF PRESENT ILLNESS:  The patient is a 81 year old gentleman. All the information is obtained from the history and physical on the chart. He has advanced dementia and atrial fibrillation, lives in a nursing facility at [facility]. He had an episode of vomiting.  It was described in the record as normal stomach contents followed by dark red emesis. I was asked to see him in consultation for apparent upper gastrointestinal bleed.   REVIEW OF SYSTEMS: Unable to obtain. The patient is completely obtunded.  PAST MEDICAL HISTORY: Advanced dementia, atrial fibrillation,  permanent pacemaker placement, sacral decubitus ulcer.   SOCIAL HISTORY: The patient resides at [facility]   FAMILY HISTORY: Unable to obtain due to patient's complete obtundation and inability to communicate.   ALLERGIES: TERAZOSIN.   MEDICATIONS: Please see the admission history and physical. His medicines include morphine, diltiazem, Lorazepam, trazodone, and Lasix.   PHYSICAL EXAMINATION:  GENERAL: Elderly white male with Cheyne-Stokes respirations. T-max 100.7, blood pressure runs from 82/60 to 105/65, oxygen saturation between 87 and 100% on 2 liters.  HEENT: There is no scleral icterus. His mouth mucosa is dry. The head is atraumatic.  PULMONARY:  He has good inspiratory effort with expiratory producing course rhonchi.  HEART: Shows irregular rate and rhythm.  ABDOMEN: Bowel sounds are present. There is no palpable hepatosplenomegaly or masses. He is unresponsive to stimuli.   LABORATORY DATA: Sodium 151, potassium 3.8, chloride 110, bicarbonate 34, BUN 26, creatinine 1.16, glucose 145, albumin 2.8, alkaline phosphatase 154, white count 11.9, hemoglobin 14.3, platelet count 297,000.   Abdominal film shows partial small bowel  obstruction or ileus, most likely an ileus.   ASSESSMENT/PLAN: He probably had a Mallory-Weiss tear, based on the story that he threw up some food and then threw up some blood. He easily could develop some aspiration pneumonia given his obtundation and his coarse breath sounds. His white count has gone from 11.9 to 20,000 today.  Possibly could be related to aspiration pneumonia. He also had very positive urine. Showed 3+ leukocyte esterase, 15-30 white cells, and he could be septic from urinary tract as well.   At this time I would not recommend any endoscopic evaluations given his near end of life condition.  I would continue comfort care only.    ____________________________ Scot Junobert T. Cable Fearn, MD rte:ts D: 10/21/2013 14:48:30 ET T: 10/21/2013 15:00:50 ET JOB#: [id number]  cc: Scot Junobert T. Kimiye Strathman, MD, <Dictator> Leo GrosserNancy J. Maloney, MD Scot JunOBERT T Naman Spychalski MD ELECTRONICALLY SIGNED 10/31/2013 17:44

## 2015-12-21 IMAGING — CR DG HIP COMPLETE 2+V*L*
1 series · 4 of 4 positions shown · non-contrast
Comparison: Pelvic radiograph November 04, 2012.

CLINICAL DATA: Fall, left-sided pain.

EXAM:
LEFT HIP - COMPLETE 2+ VIEW

[Series 1: x pelvis · 0.14mm/px · 4 of 4 slices shown]
[im 1/4]
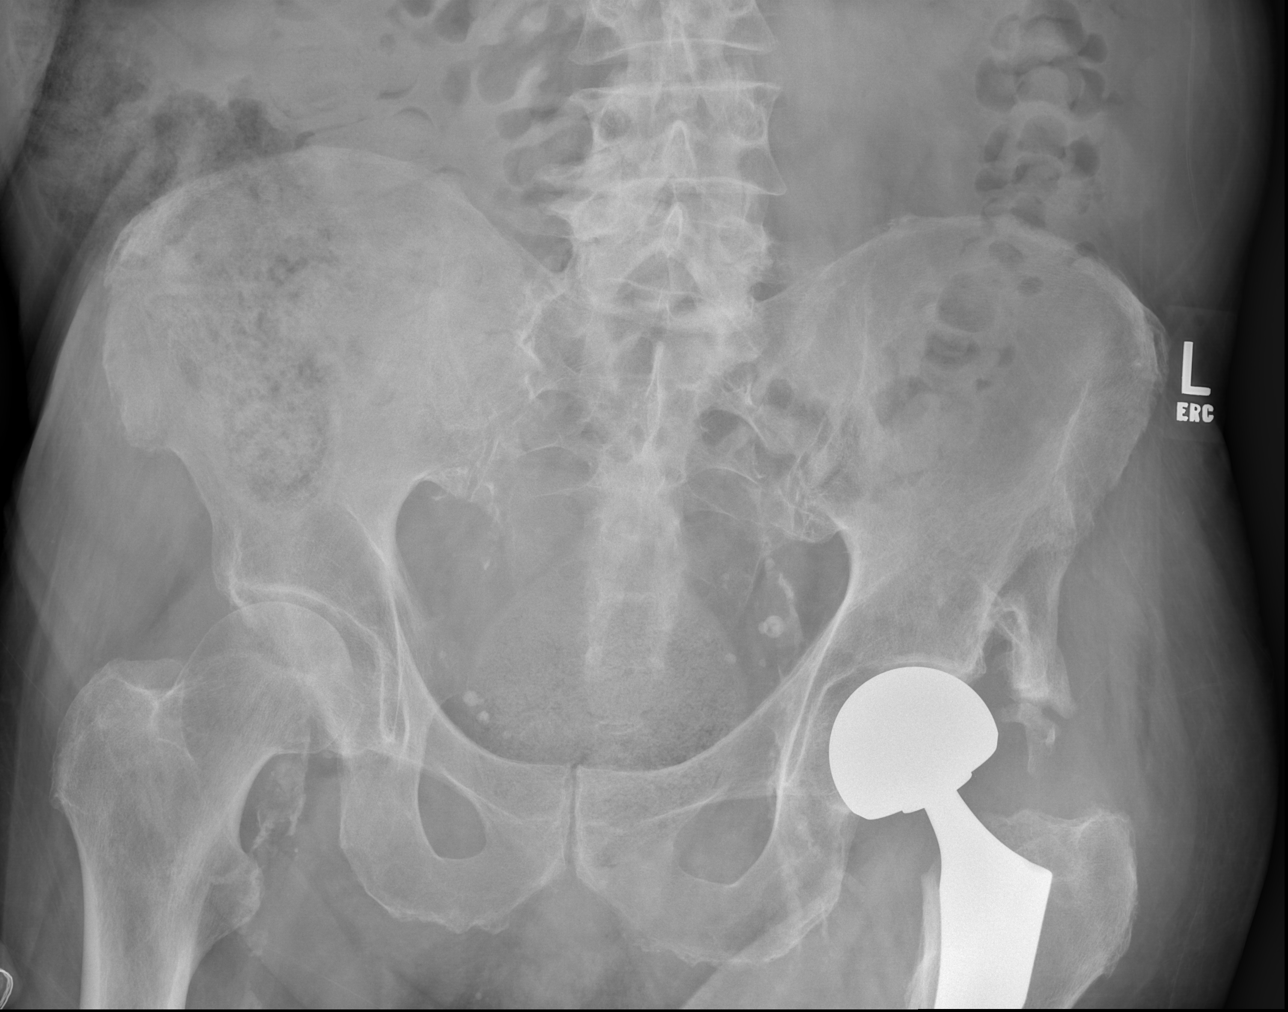
[im 2/4]
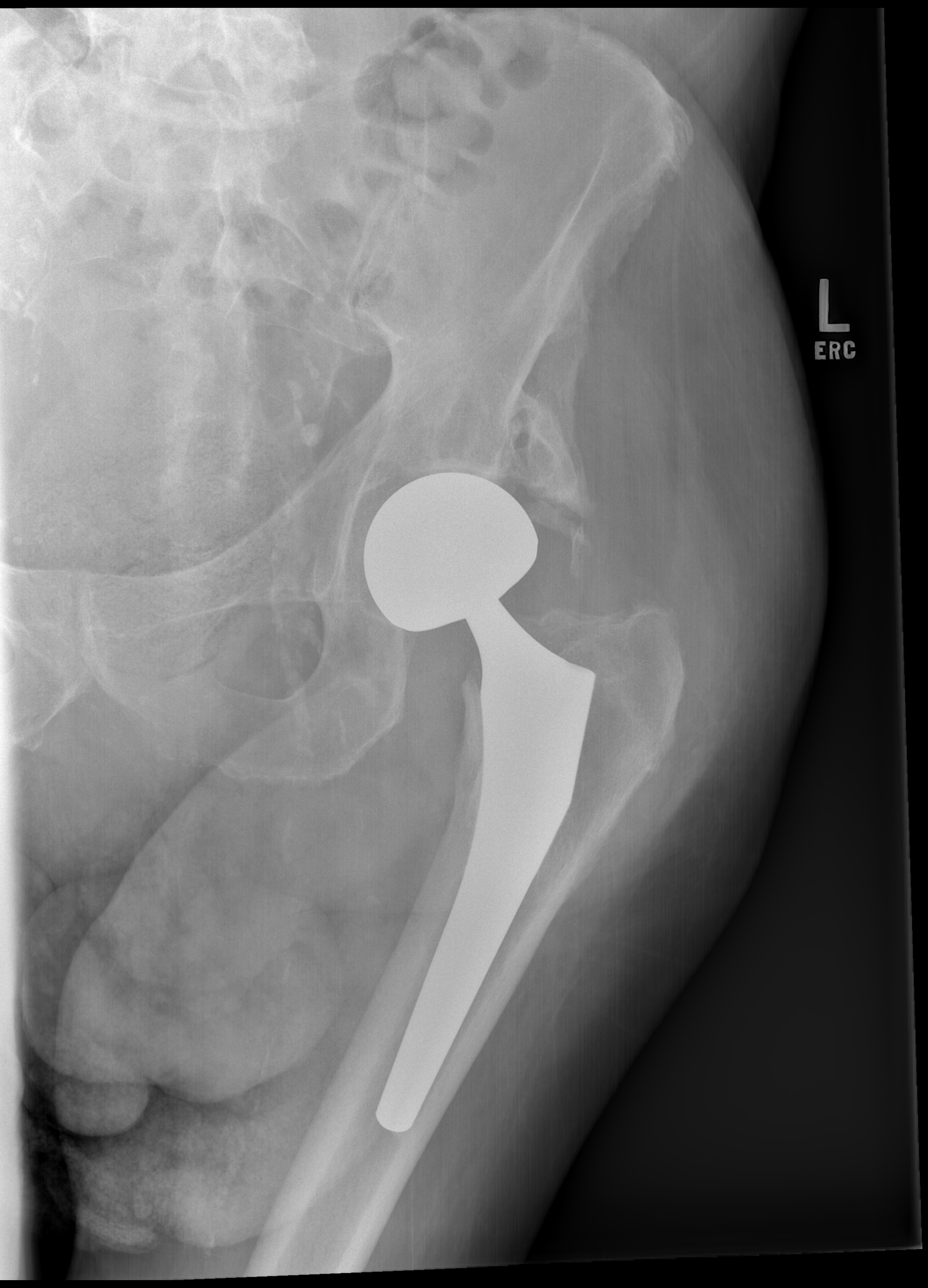
[im 3/4]
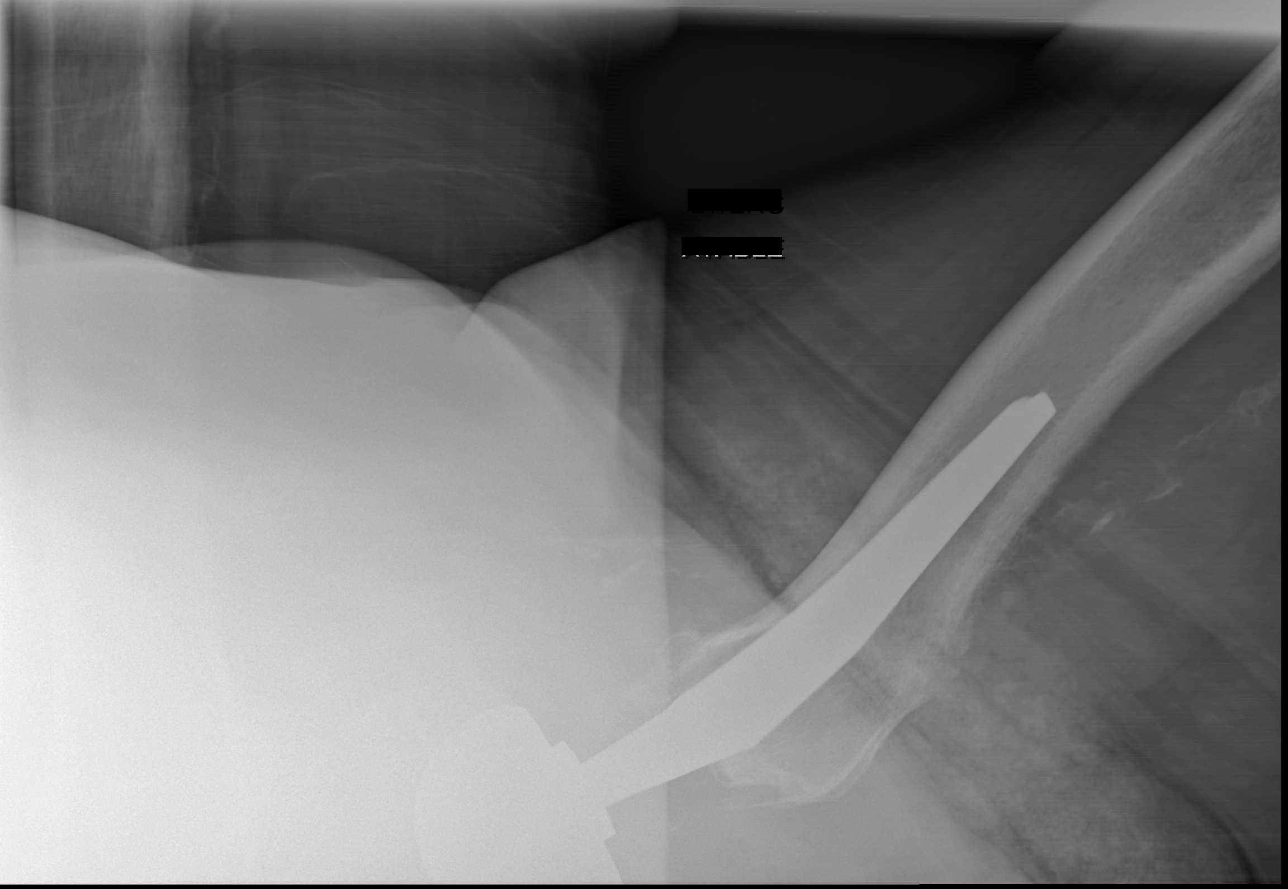
[im 4/4]
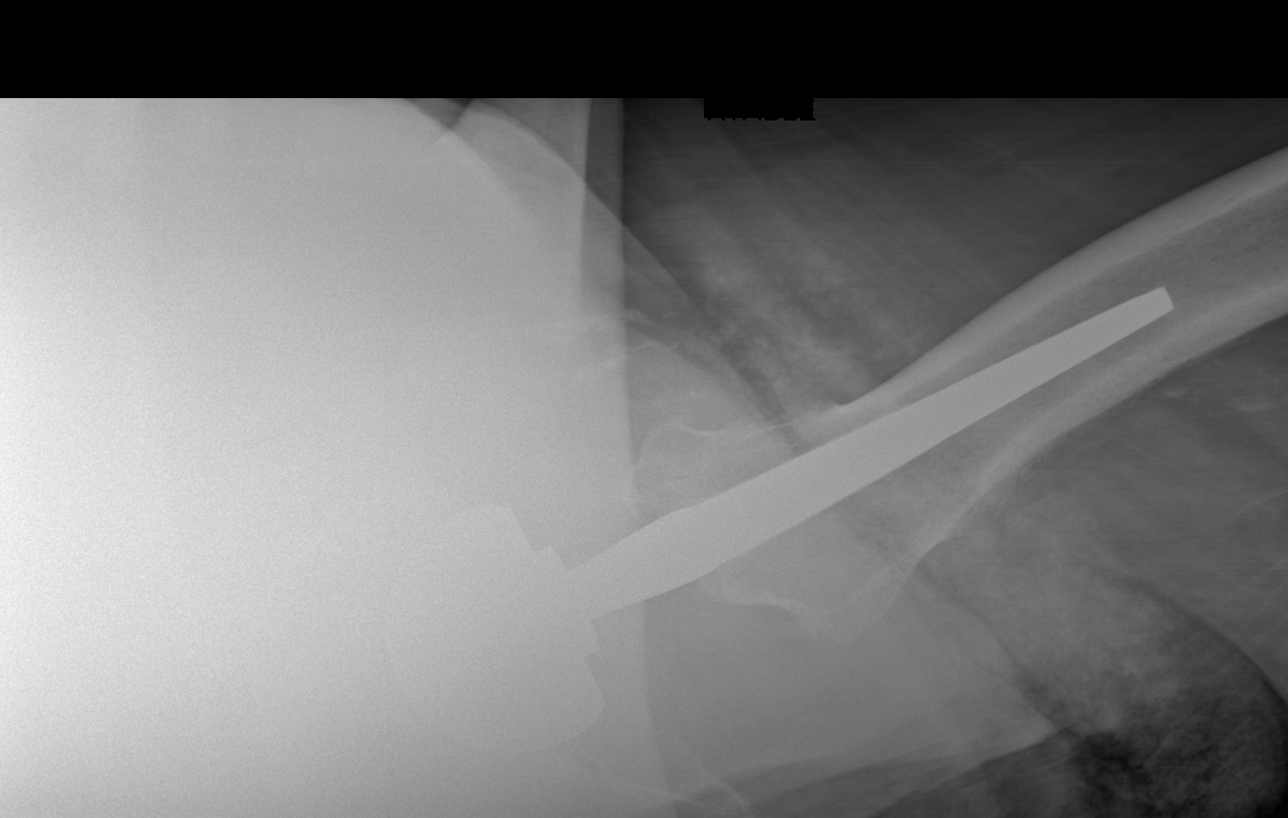

[4 of 4 positions shown; findings below may reference images not displayed]

FINDINGS: No acute fracture deformity nor dislocation. Status post left hip
hemiarthroplasty with intact well seated non cemented hardware, no
periprosthetic lucency. Heterotopic ossification about the
acetabulum, unchanged.

No destructive bony lesions. Severe aortoiliac vascular
calcifications. Phleboliths in the pelvis. Large amount of stool
projecting in the rectal vault.
IMPRESSION: Status post left hip hemiarthroplasty without radiographic findings
of hardware failure nor acute fracture deformity or dislocation.

  By: Lannie Devon
